# Patient Record
Sex: Female | Born: 1990 | Race: White | Hispanic: No | Marital: Single | Smoking: Current every day smoker
Health system: Southern US, Community
[De-identification: ages and names within clinical notes are randomized; demographics above are authoritative.]

## PROBLEM LIST (undated history)

## (undated) DIAGNOSIS — F419 Anxiety disorder, unspecified: Secondary | ICD-10-CM

## (undated) DIAGNOSIS — F99 Mental disorder, not otherwise specified: Secondary | ICD-10-CM

## (undated) DIAGNOSIS — J189 Pneumonia, unspecified organism: Secondary | ICD-10-CM

## (undated) DIAGNOSIS — J45909 Unspecified asthma, uncomplicated: Secondary | ICD-10-CM

## (undated) DIAGNOSIS — E569 Vitamin deficiency, unspecified: Secondary | ICD-10-CM

## (undated) HISTORY — PX: WISDOM TOOTH EXTRACTION: SHX21

---

## 2010-08-04 ENCOUNTER — Inpatient Hospital Stay (HOSPITAL_COMMUNITY): Admission: AD | Admit: 2010-08-04 | Discharge: 2010-08-07 | Payer: Self-pay | Admitting: Obstetrics and Gynecology

## 2010-12-13 LAB — CBC
HCT: 32.4 % — ABNORMAL LOW (ref 36.0–46.0)
HCT: 38.4 % (ref 36.0–46.0)
Hemoglobin: 11.4 g/dL — ABNORMAL LOW (ref 12.0–15.0)
Hemoglobin: 13.4 g/dL (ref 12.0–15.0)
MCH: 35.4 pg — ABNORMAL HIGH (ref 26.0–34.0)
MCV: 100.2 fL — ABNORMAL HIGH (ref 78.0–100.0)
MCV: 101 fL — ABNORMAL HIGH (ref 78.0–100.0)
Platelets: 124 10*3/uL — ABNORMAL LOW (ref 150–400)
RBC: 3.23 MIL/uL — ABNORMAL LOW (ref 3.87–5.11)
RBC: 3.8 MIL/uL — ABNORMAL LOW (ref 3.87–5.11)
RDW: 13.3 % (ref 11.5–15.5)
WBC: 10.4 10*3/uL (ref 4.0–10.5)
WBC: 9.2 10*3/uL (ref 4.0–10.5)

## 2010-12-13 LAB — RH IMMUNE GLOB WKUP(>/=20WKS)(NOT WOMEN'S HOSP)
DAT, IgG: NEGATIVE
Unit division: 0

## 2011-05-13 ENCOUNTER — Emergency Department (HOSPITAL_COMMUNITY)
Admission: EM | Admit: 2011-05-13 | Discharge: 2011-05-13 | Disposition: A | Discharge status: Home / Self Care | Payer: No Typology Code available for payment source | Attending: Emergency Medicine | Admitting: Emergency Medicine

## 2011-05-13 ENCOUNTER — Emergency Department (HOSPITAL_COMMUNITY): Payer: No Typology Code available for payment source

## 2011-05-13 DIAGNOSIS — R51 Headache: Secondary | ICD-10-CM | POA: Insufficient documentation

## 2011-05-13 DIAGNOSIS — M542 Cervicalgia: Secondary | ICD-10-CM | POA: Insufficient documentation

## 2011-05-13 DIAGNOSIS — T148XXA Other injury of unspecified body region, initial encounter: Secondary | ICD-10-CM | POA: Insufficient documentation

## 2011-05-13 DIAGNOSIS — S1093XA Contusion of unspecified part of neck, initial encounter: Secondary | ICD-10-CM | POA: Insufficient documentation

## 2011-05-13 DIAGNOSIS — S0003XA Contusion of scalp, initial encounter: Secondary | ICD-10-CM | POA: Insufficient documentation

## 2011-12-25 ENCOUNTER — Other Ambulatory Visit (HOSPITAL_COMMUNITY): Payer: Self-pay | Admitting: Obstetrics and Gynecology

## 2011-12-25 DIAGNOSIS — N83209 Unspecified ovarian cyst, unspecified side: Secondary | ICD-10-CM

## 2011-12-28 ENCOUNTER — Ambulatory Visit (HOSPITAL_COMMUNITY)
Admission: RE | Admit: 2011-12-28 | Discharge: 2011-12-28 | Disposition: A | Discharge status: Home / Self Care | Payer: Medicaid Other | Source: Ambulatory Visit | Attending: Obstetrics and Gynecology | Admitting: Obstetrics and Gynecology

## 2011-12-28 DIAGNOSIS — IMO0002 Reserved for concepts with insufficient information to code with codable children: Secondary | ICD-10-CM | POA: Insufficient documentation

## 2011-12-28 DIAGNOSIS — N83209 Unspecified ovarian cyst, unspecified side: Secondary | ICD-10-CM | POA: Insufficient documentation

## 2011-12-28 DIAGNOSIS — Z30431 Encounter for routine checking of intrauterine contraceptive device: Secondary | ICD-10-CM | POA: Insufficient documentation

## 2011-12-28 DIAGNOSIS — R1032 Left lower quadrant pain: Secondary | ICD-10-CM | POA: Insufficient documentation
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
## 2013-08-07 ENCOUNTER — Encounter (INDEPENDENT_AMBULATORY_CARE_PROVIDER_SITE_OTHER): Payer: Self-pay | Admitting: Surgery

## 2013-08-11 ENCOUNTER — Encounter (INDEPENDENT_AMBULATORY_CARE_PROVIDER_SITE_OTHER): Payer: Self-pay

## 2013-08-11 ENCOUNTER — Encounter (INDEPENDENT_AMBULATORY_CARE_PROVIDER_SITE_OTHER): Payer: Self-pay | Admitting: Surgery

## 2013-08-11 ENCOUNTER — Ambulatory Visit (INDEPENDENT_AMBULATORY_CARE_PROVIDER_SITE_OTHER): Payer: Medicaid Other | Admitting: Surgery

## 2013-08-11 VITALS — BP 108/70 | HR 96 | Temp 98.3°F | Resp 15 | Ht 71.0 in | Wt 125.0 lb

## 2013-08-11 DIAGNOSIS — K8020 Calculus of gallbladder without cholecystitis without obstruction: Secondary | ICD-10-CM

## 2013-08-11 NOTE — Progress Notes (Signed)
Patient ID: Sara Fletcher, female   DOB: 09/23/1991, 22 y.o.   MRN: [id number]  Chief Complaint  Patient presents with  . New Evaluation    eval Gallbladder    HPI Sara Fletcher is a 22 y.o. female.   HPI This is a pleasant 22 year old female referred by Dr. Mathis Bud for evaluation of symptomatic cholelithiasis. She essentially had right upper quadrant abdominal discomfort and intermittent nausea and vomiting for many years. It is becoming worse. The pain is moderate in intensity and always remains in the right upper quadrant. It is sharp in nature. She is otherwise without complaints. She denies jaundice. History reviewed. No pertinent past medical history.  History reviewed. No pertinent past surgical history.  Family History  Problem Relation Age of Onset  . Hypertension Mother   . Kidney disease Mother     recurring kidney stones    Social History History  Substance Use Topics  . Smoking status: Former Smoker    Quit date: 07/31/2013  . Smokeless tobacco: Never Used  . Alcohol Use: No     Comment: occasional    No Known Allergies  Current Outpatient Prescriptions  Medication Sig Dispense Refill  . dicyclomine (BENTYL) 20 MG tablet Take 20 mg by mouth every 6 (six) hours.      . ondansetron (ZOFRAN-ODT) 4 MG disintegrating tablet Take 4 mg by mouth every 8 (eight) hours as needed for nausea or vomiting.      . Vitamin D, Ergocalciferol, (DRISDOL) 50000 UNITS CAPS capsule Take 50,000 Units by mouth every 7 (seven) days.       No current facility-administered medications for this visit.    Review of Systems Review of Systems  Constitutional: Negative for fever, chills and unexpected weight change.  HENT: Negative for congestion, hearing loss, sore throat, trouble swallowing and voice change.   Eyes: Negative for visual disturbance.  Respiratory: Negative for cough and wheezing.   Cardiovascular: Negative for chest pain, palpitations and leg swelling.  Gastrointestinal:  Negative for nausea, vomiting, abdominal pain, diarrhea, constipation, blood in stool, abdominal distention and anal bleeding.  Genitourinary: Negative for hematuria, vaginal bleeding and difficulty urinating.  Musculoskeletal: Negative for arthralgias.  Skin: Negative for rash and wound.  Neurological: Negative for seizures, syncope and headaches.  Hematological: Negative for adenopathy. Does not bruise/bleed easily.  Psychiatric/Behavioral: Negative for confusion.    Blood pressure 108/70, pulse 96, temperature 98.3 F (36.8 C), temperature source Temporal, resp. rate 15, height 5\' 11"  (1.803 m), weight 125 lb (56.7 kg).  Physical Exam Physical Exam  Constitutional: She is oriented to person, place, and time. She appears well-developed and well-nourished. No distress.  HENT:  Head: Normocephalic and atraumatic.  Right Ear: External ear normal.  Left Ear: External ear normal.  Nose: Nose normal.  Mouth/Throat: Oropharynx is clear and moist. No oropharyngeal exudate.  Eyes: Conjunctivae are normal. Pupils are equal, round, and reactive to light. Right eye exhibits no discharge. Left eye exhibits no discharge. No scleral icterus.  Neck: Normal range of motion. Neck supple. No tracheal deviation present.  Cardiovascular: Normal rate, regular rhythm, normal heart sounds and intact distal pulses.   No murmur heard. Pulmonary/Chest: Effort normal and breath sounds normal. No respiratory distress. She has no wheezes. She has no rales.  Abdominal: Soft. Bowel sounds are normal. She exhibits no distension. There is no tenderness. There is no rebound.  Musculoskeletal: Normal range of motion. She exhibits no edema and no tenderness.  Lymphadenopathy:    She has no  cervical adenopathy.  Neurological: She is alert and oriented to person, place, and time.  Skin: Skin is warm and dry. No rash noted. She is not diaphoretic. No erythema.  Psychiatric: Her behavior is normal. Judgment normal.     Data Reviewed I have reviewed her data from her primary care physician. Her ultrasound shows cholelithiasis with a lot of gallstones. The bile duct is normal.  Her liver function tests are normal  Assessment    Symptomatic cholelithiasis     Plan    Laparoscopic cholecystectomy with possible cholangiogram is recommended. I discussed this with her in detail and gave her literature regarding the surgery. I discussed the risks of surgery which includes but is not limited to bleeding, infection, bile duct injury, bile leak, injury to other structures, need to convert to an open procedure, et Karie Soda. I also discussed postoperative recovery. She wishes to proceed with surgery        Zykeem Bauserman A 08/11/2013, 10:22 AM

## 2013-08-13 ENCOUNTER — Encounter (HOSPITAL_COMMUNITY): Payer: Self-pay | Admitting: Pharmacy Technician

## 2013-08-13 ENCOUNTER — Encounter (INDEPENDENT_AMBULATORY_CARE_PROVIDER_SITE_OTHER): Payer: Self-pay

## 2013-08-18 ENCOUNTER — Encounter (HOSPITAL_COMMUNITY)
Admission: RE | Admit: 2013-08-18 | Discharge: 2013-08-18 | Disposition: A | Discharge status: Home / Self Care | Payer: Medicaid Other | Source: Ambulatory Visit | Attending: Surgery | Admitting: Surgery

## 2013-08-18 ENCOUNTER — Encounter (HOSPITAL_COMMUNITY): Payer: Self-pay

## 2013-08-18 HISTORY — DX: Mental disorder, not otherwise specified: F99

## 2013-08-18 HISTORY — DX: Unspecified asthma, uncomplicated: J45.909

## 2013-08-18 HISTORY — DX: Anxiety disorder, unspecified: F41.9

## 2013-08-18 HISTORY — DX: Pneumonia, unspecified organism: J18.9

## 2013-08-18 HISTORY — DX: Vitamin deficiency, unspecified: E56.9

## 2013-08-18 LAB — CBC
HCT: 38.2 % (ref 36.0–46.0)
Hemoglobin: 13.2 g/dL (ref 12.0–15.0)
MCH: 32.5 pg (ref 26.0–34.0)
MCHC: 34.6 g/dL (ref 30.0–36.0)
Platelets: 134 10*3/uL — ABNORMAL LOW (ref 150–400)
RDW: 12.2 % (ref 11.5–15.5)
WBC: 3.9 10*3/uL — ABNORMAL LOW (ref 4.0–10.5)

## 2013-08-18 LAB — HCG, SERUM, QUALITATIVE: Preg, Serum: NEGATIVE

## 2013-08-18 NOTE — Pre-Procedure Instructions (Signed)
Sara Fletcher  08/18/2013   Your procedure is scheduled on: Wednesday, November 19th.              Report to [facility], Main Entrance or Entrance "A" at 9:47M.  Call this number if you have problems the morning of surgery: [phone]   Remember:   Do not eat food or drink liquids after midnight.   Take these medicines the morning of surgery with A SIP OF WATER: None   Do not wear jewelry, make-up or nail polish.  Do not wear lotions, powders, or perfumes. You may wear deodorant.  Do not shave 48 hours prior to surgery. Men may shave face and neck.  Do not bring valuables to the hospital.  [facility] is not responsible for any belongings or valuables.               Contacts, dentures or bridgework may not be worn into surgery.  Leave suitcase in the car. After surgery it may be brought to your room.  For patients admitted to the hospital, discharge time is determined by your treatment team.               Patients discharged the day of surgery will not be allowed to drive home.  Name and phone number of your driver: -   Special Instructions: Shower using CHG 2 nights before surgery and the night before surgery.  If you shower the day of surgery use CHG.  Use special wash - you have one bottle of CHG for all showers.  You should use approximately 1/3 of the bottle for each shower.   Please read over the following fact sheets that you were given: Pain Booklet, Coughing and Deep Breathing and Surgical Site Infection Prevention

## 2013-08-18 NOTE — Pre-Procedure Instructions (Signed)
Sara Fletcher  08/18/2013   Your procedure is scheduled on: Wednesday, November 19th.              Report to [facility], Main Entrance or Entrance "A" at 9:4M.  Call this number if you have problems the morning of surgery: [phone]   Remember:   Do not eat food or drink liquids after midnight.   Take these medicines the morning of surgery with A SIP OF WATER: None   Do not wear jewelry, make-up or nail polish.  Do not wear lotions, powders, or perfumes. You may wear deodorant.  Do not shave 48 hours prior to surgery.   Do not bring valuables to the hospital.  [facility] is not responsible for any belongings or valuables.               Contacts, dentures or bridgework may not be worn into surgery.  Leave suitcase in the car. After surgery it may be brought to your room.  For patients admitted to the hospital, discharge time is determined by your treatment team.               Patients discharged the day of surgery will not be allowed to drive home.  Name and phone number of your driver: - Angie Fava [phone]   Special Instructions: Shower using CHG 2 nights before surgery and the night before surgery.  If you shower the day of surgery use CHG.  Use special wash - you have one bottle of CHG for all showers.  You should use approximately 1/3 of the bottle for each shower.   Please read over the following fact sheets that you were given: Pain Booklet, Coughing and Deep Breathing and Surgical Site Infection Prevention

## 2013-08-19 ENCOUNTER — Encounter (HOSPITAL_COMMUNITY): Payer: Self-pay | Admitting: Certified Registered"

## 2013-08-19 MED ORDER — CEFAZOLIN SODIUM-DEXTROSE 2-3 GM-% IV SOLR
2.0000 g | INTRAVENOUS | Status: AC
  Administered 2013-08-20: 2 g via INTRAVENOUS
  Filled 2013-08-19: qty 50

## 2013-08-20 ENCOUNTER — Observation Stay (HOSPITAL_COMMUNITY)
Admission: RE | Admit: 2013-08-20 | Discharge: 2013-08-21 | Disposition: A | Discharge status: Home / Self Care | Payer: Medicaid Other | Source: Ambulatory Visit | Attending: Surgery | Admitting: Surgery

## 2013-08-20 ENCOUNTER — Ambulatory Visit (HOSPITAL_COMMUNITY): Payer: Medicaid Other | Admitting: Certified Registered"

## 2013-08-20 ENCOUNTER — Encounter (HOSPITAL_COMMUNITY)
Admission: RE | Disposition: A | Discharge status: Home / Self Care | Payer: Self-pay | Source: Ambulatory Visit | Attending: Surgery

## 2013-08-20 ENCOUNTER — Encounter (HOSPITAL_COMMUNITY): Payer: Self-pay | Admitting: *Deleted

## 2013-08-20 ENCOUNTER — Encounter (HOSPITAL_COMMUNITY): Payer: Medicaid Other | Admitting: Certified Registered"

## 2013-08-20 DIAGNOSIS — K8010 Calculus of gallbladder with chronic cholecystitis without obstruction: Principal | ICD-10-CM | POA: Insufficient documentation

## 2013-08-20 DIAGNOSIS — Z87891 Personal history of nicotine dependence: Secondary | ICD-10-CM | POA: Insufficient documentation

## 2013-08-20 DIAGNOSIS — F411 Generalized anxiety disorder: Secondary | ICD-10-CM | POA: Insufficient documentation

## 2013-08-20 DIAGNOSIS — F3289 Other specified depressive episodes: Secondary | ICD-10-CM | POA: Insufficient documentation

## 2013-08-20 DIAGNOSIS — J45909 Unspecified asthma, uncomplicated: Secondary | ICD-10-CM | POA: Insufficient documentation

## 2013-08-20 DIAGNOSIS — F329 Major depressive disorder, single episode, unspecified: Secondary | ICD-10-CM | POA: Insufficient documentation

## 2013-08-20 HISTORY — PX: CHOLECYSTECTOMY: SHX55

## 2013-08-20 SURGERY — LAPAROSCOPIC CHOLECYSTECTOMY
Anesthesia: General | Site: Abdomen | Wound class: Clean Contaminated

## 2013-08-20 MED ORDER — ONDANSETRON HCL 4 MG/2ML IJ SOLN
INTRAMUSCULAR | Status: DC | PRN
  Administered 2013-08-20: 4 mg via INTRAVENOUS

## 2013-08-20 MED ORDER — ROCURONIUM BROMIDE 100 MG/10ML IV SOLN
INTRAVENOUS | Status: DC | PRN
  Administered 2013-08-20: 35 mg via INTRAVENOUS

## 2013-08-20 MED ORDER — ONDANSETRON HCL 4 MG/2ML IJ SOLN
4.0000 mg | Freq: Four times a day (QID) | INTRAMUSCULAR | Status: DC | PRN
  Administered 2013-08-20: 4 mg via INTRAVENOUS
  Filled 2013-08-20: qty 2

## 2013-08-20 MED ORDER — FENTANYL CITRATE 0.05 MG/ML IJ SOLN
INTRAMUSCULAR | Status: DC | PRN
  Administered 2013-08-20 (×2): 50 ug via INTRAVENOUS
  Administered 2013-08-20: 100 ug via INTRAVENOUS
  Administered 2013-08-20: 50 ug via INTRAVENOUS

## 2013-08-20 MED ORDER — LIDOCAINE HCL (CARDIAC) 20 MG/ML IV SOLN
INTRAVENOUS | Status: DC | PRN
  Administered 2013-08-20: 80 mg via INTRAVENOUS

## 2013-08-20 MED ORDER — OXYCODONE HCL 5 MG PO TABS
5.0000 mg | ORAL_TABLET | ORAL | Status: DC | PRN
  Administered 2013-08-20: 5 mg via ORAL
  Administered 2013-08-21 (×2): 10 mg via ORAL
  Filled 2013-08-20 (×2): qty 2
  Filled 2013-08-20: qty 1
  Filled 2013-08-20 (×2): qty 2

## 2013-08-20 MED ORDER — HYDROCODONE-ACETAMINOPHEN 5-325 MG PO TABS: 1.0000 | ORAL_TABLET | ORAL | Status: DC | PRN

## 2013-08-20 MED ORDER — HYDROMORPHONE HCL PF 1 MG/ML IJ SOLN
0.2500 mg | INTRAMUSCULAR | Status: DC | PRN
  Administered 2013-08-20 (×4): 0.5 mg via INTRAVENOUS

## 2013-08-20 MED ORDER — HYDROMORPHONE HCL PF 1 MG/ML IJ SOLN
INTRAMUSCULAR | Status: AC
  Filled 2013-08-20: qty 1

## 2013-08-20 MED ORDER — HYDROMORPHONE HCL PF 1 MG/ML IJ SOLN
INTRAMUSCULAR | Status: DC | PRN
  Administered 2013-08-20 (×2): 0.5 mg via INTRAVENOUS

## 2013-08-20 MED ORDER — BUPIVACAINE HCL (PF) 0.25 % IJ SOLN
INTRAMUSCULAR | Status: AC
  Filled 2013-08-20: qty 30

## 2013-08-20 MED ORDER — ONDANSETRON HCL 4 MG/2ML IJ SOLN
4.0000 mg | Freq: Once | INTRAMUSCULAR | Status: AC | PRN
  Administered 2013-08-20: 4 mg via INTRAVENOUS
  Filled 2013-08-20: qty 2

## 2013-08-20 MED ORDER — ONDANSETRON HCL 4 MG/2ML IJ SOLN: INTRAMUSCULAR | Status: DC | PRN

## 2013-08-20 MED ORDER — MIDAZOLAM HCL 5 MG/5ML IJ SOLN
INTRAMUSCULAR | Status: DC | PRN
  Administered 2013-08-20: 2 mg via INTRAVENOUS
  Administered 2013-08-20: 1 mg via INTRAVENOUS

## 2013-08-20 MED ORDER — SODIUM CHLORIDE 0.9 % IJ SOLN: 3.0000 mL | INTRAMUSCULAR | Status: DC | PRN

## 2013-08-20 MED ORDER — BUPIVACAINE-EPINEPHRINE 0.25% -1:200000 IJ SOLN
INTRAMUSCULAR | Status: DC | PRN
  Administered 2013-08-20: 20 mL

## 2013-08-20 MED ORDER — LACTATED RINGERS IV SOLN
INTRAVENOUS | Status: DC | PRN
  Administered 2013-08-20 (×2): via INTRAVENOUS

## 2013-08-20 MED ORDER — MORPHINE SULFATE 4 MG/ML IJ SOLN
4.0000 mg | INTRAMUSCULAR | Status: DC | PRN
  Administered 2013-08-20 – 2013-08-21 (×2): 4 mg via INTRAVENOUS
  Filled 2013-08-20 (×2): qty 1

## 2013-08-20 MED ORDER — SODIUM CHLORIDE 0.9 % IV SOLN: 250.0000 mL | INTRAVENOUS | Status: DC | PRN

## 2013-08-20 MED ORDER — LACTATED RINGERS IV SOLN
INTRAVENOUS | Status: DC
  Administered 2013-08-20: 11:00:00 via INTRAVENOUS

## 2013-08-20 MED ORDER — KETOROLAC TROMETHAMINE 30 MG/ML IJ SOLN
INTRAMUSCULAR | Status: DC | PRN
  Administered 2013-08-20: 30 mg via INTRAVENOUS

## 2013-08-20 MED ORDER — PROPOFOL 10 MG/ML IV BOLUS
INTRAVENOUS | Status: DC | PRN
  Administered 2013-08-20: 100 mg via INTRAVENOUS

## 2013-08-20 MED ORDER — SODIUM CHLORIDE 0.9 % IJ SOLN: 3.0000 mL | Freq: Two times a day (BID) | INTRAMUSCULAR | Status: DC

## 2013-08-20 MED ORDER — ACETAMINOPHEN 650 MG RE SUPP: 650.0000 mg | RECTAL | Status: DC | PRN

## 2013-08-20 MED ORDER — DEXAMETHASONE SODIUM PHOSPHATE 10 MG/ML IJ SOLN
INTRAMUSCULAR | Status: DC | PRN
  Administered 2013-08-20: 8 mg via INTRAVENOUS

## 2013-08-20 MED ORDER — NEOSTIGMINE METHYLSULFATE 1 MG/ML IJ SOLN
INTRAMUSCULAR | Status: DC | PRN
  Administered 2013-08-20: 3 mg via INTRAVENOUS

## 2013-08-20 MED ORDER — ACETAMINOPHEN 325 MG PO TABS
650.0000 mg | ORAL_TABLET | ORAL | Status: DC | PRN
Start: 2013-08-20 — End: 2013-08-21

## 2013-08-20 MED ORDER — DEXMEDETOMIDINE HCL IN NACL 200 MCG/50ML IV SOLN
0.4000 ug/kg/h | INTRAVENOUS | Status: AC
  Administered 2013-08-20: 24 ug/h via INTRAVENOUS
  Filled 2013-08-20: qty 50

## 2013-08-20 MED ORDER — SODIUM CHLORIDE 0.9 % IR SOLN
Status: DC | PRN
  Administered 2013-08-20: 1000 mL

## 2013-08-20 MED ORDER — GLYCOPYRROLATE 0.2 MG/ML IJ SOLN
INTRAMUSCULAR | Status: DC | PRN
  Administered 2013-08-20: 0.4 mg via INTRAVENOUS

## 2013-08-20 MED ORDER — KETOROLAC TROMETHAMINE 30 MG/ML IJ SOLN
15.0000 mg | Freq: Once | INTRAMUSCULAR | Status: DC | PRN
Start: 2013-08-20 — End: 2013-08-20

## 2013-08-20 SURGICAL SUPPLY — 36 items
APPLIER CLIP 5 13 M/L LIGAMAX5 (MISCELLANEOUS) ×2
BANDAGE ADHESIVE 1X3 (GAUZE/BANDAGES/DRESSINGS) ×8 IMPLANT
BENZOIN TINCTURE PRP APPL 2/3 (GAUZE/BANDAGES/DRESSINGS) ×2 IMPLANT
CANISTER SUCTION 2500CC (MISCELLANEOUS) ×2 IMPLANT
CHLORAPREP W/TINT 26ML (MISCELLANEOUS) ×2 IMPLANT
CLIP APPLIE 5 13 M/L LIGAMAX5 (MISCELLANEOUS) ×1 IMPLANT
CLSR STERI-STRIP ANTIMIC 1/2X4 (GAUZE/BANDAGES/DRESSINGS) ×2 IMPLANT
COVER MAYO STAND STRL (DRAPES) IMPLANT
COVER SURGICAL LIGHT HANDLE (MISCELLANEOUS) ×2 IMPLANT
DECANTER SPIKE VIAL GLASS SM (MISCELLANEOUS) ×2 IMPLANT
DRAPE C-ARM 42X72 X-RAY (DRAPES) IMPLANT
ELECT REM PT RETURN 9FT ADLT (ELECTROSURGICAL) ×2
ELECTRODE REM PT RTRN 9FT ADLT (ELECTROSURGICAL) ×1 IMPLANT
GLOVE SS BIOGEL STRL SZ 7 (GLOVE) ×1 IMPLANT
GLOVE SUPERSENSE BIOGEL SZ 7 (GLOVE) ×1
GLOVE SURG SIGNA 7.5 PF LTX (GLOVE) ×2 IMPLANT
GLOVE SURG SS PI 6.5 STRL IVOR (GLOVE) ×2 IMPLANT
GOWN STRL NON-REIN LRG LVL3 (GOWN DISPOSABLE) ×6 IMPLANT
GOWN STRL REIN XL XLG (GOWN DISPOSABLE) ×2 IMPLANT
KIT BASIN OR (CUSTOM PROCEDURE TRAY) ×2 IMPLANT
KIT ROOM TURNOVER OR (KITS) ×2 IMPLANT
NS IRRIG 1000ML POUR BTL (IV SOLUTION) ×2 IMPLANT
PAD ARMBOARD 7.5X6 YLW CONV (MISCELLANEOUS) ×2 IMPLANT
POUCH SPECIMEN RETRIEVAL 10MM (ENDOMECHANICALS) ×2 IMPLANT
SCISSORS LAP 5X35 DISP (ENDOMECHANICALS) ×2 IMPLANT
SET CHOLANGIOGRAPH 5 50 .035 (SET/KITS/TRAYS/PACK) IMPLANT
SET IRRIG TUBING LAPAROSCOPIC (IRRIGATION / IRRIGATOR) ×2 IMPLANT
SLEEVE ENDOPATH XCEL 5M (ENDOMECHANICALS) ×4 IMPLANT
SPECIMEN JAR SMALL (MISCELLANEOUS) ×2 IMPLANT
SUT MON AB 4-0 PC3 18 (SUTURE) ×2 IMPLANT
TOWEL OR 17X24 6PK STRL BLUE (TOWEL DISPOSABLE) ×2 IMPLANT
TOWEL OR 17X26 10 PK STRL BLUE (TOWEL DISPOSABLE) ×2 IMPLANT
TRAY LAPAROSCOPIC (CUSTOM PROCEDURE TRAY) ×2 IMPLANT
TROCAR XCEL BLUNT TIP 100MML (ENDOMECHANICALS) ×2 IMPLANT
TROCAR XCEL NON-BLD 5MMX100MML (ENDOMECHANICALS) ×2 IMPLANT
WATER STERILE IRR 1000ML POUR (IV SOLUTION) IMPLANT

## 2013-08-20 NOTE — Op Note (Signed)
Laparoscopic Cholecystectomy Procedure Note  Indications: This patient presents with symptomatic gallbladder disease and will undergo laparoscopic cholecystectomy.  Pre-operative Diagnosis: Calculus of gallbladder without mention of cholecystitis or obstruction  Post-operative Diagnosis: Same  Surgeon: Abigail Miyamoto A   Assistants: 0  Anesthesia: General endotracheal anesthesia  ASA Class: 1  Procedure Details  The patient was seen again in the Holding Room. The risks, benefits, complications, treatment options, and expected outcomes were discussed with the patient. The possibilities of reaction to medication, pulmonary aspiration, perforation of viscus, bleeding, recurrent infection, finding a normal gallbladder, the need for additional procedures, failure to diagnose a condition, the possible need to convert to an open procedure, and creating a complication requiring transfusion or operation were discussed with the patient. The likelihood of improving the patient's symptoms with return to their baseline status is good.  The patient and/or family concurred with the proposed plan, giving informed consent. The site of surgery properly noted. The patient was taken to Operating Room, identified as Sara Fletcher and the procedure verified as Laparoscopic Cholecystectomy with Intraoperative Cholangiogram. A Time Out was held and the above information confirmed.  Prior to the induction of general anesthesia, antibiotic prophylaxis was administered. General endotracheal anesthesia was then administered and tolerated well. After the induction, the abdomen was prepped with Chloraprep and draped in sterile fashion. The patient was positioned in the supine position.  Local anesthetic agent was injected into the skin near the umbilicus and an incision made. We dissected down to the abdominal fascia with blunt dissection.  The fascia was incised vertically and we entered the peritoneal cavity bluntly.  A  pursestring suture of 0-Vicryl was placed around the fascial opening.  The Hasson cannula was inserted and secured with the stay suture.  Pneumoperitoneum was then created with CO2 and tolerated well without any adverse changes in the patient's vital signs. An 11-mm port was placed in the subxiphoid position.  Two 5-mm ports were placed in the right upper quadrant. All skin incisions were infiltrated with a local anesthetic agent before making the incision and placing the trocars.   We positioned the patient in reverse Trendelenburg, tilted slightly to the patient's left.  The gallbladder was identified, the fundus grasped and retracted cephalad. Adhesions were lysed bluntly and with the electrocautery where indicated, taking care not to injure any adjacent organs or viscus. The infundibulum was grasped and retracted laterally, exposing the peritoneum overlying the triangle of Calot. This was then divided and exposed in a blunt fashion. The cystic duct was clearly identified and bluntly dissected circumferentially. A critical view of the cystic duct and cystic artery was obtained.  The cystic duct was then ligated with clips and divided. The cystic artery was, dissected free, ligated with clips and divided as well.   The gallbladder was dissected from the liver bed in retrograde fashion with the electrocautery. The gallbladder was removed and placed in an Endocatch sac. The liver bed was irrigated and inspected. Hemostasis was achieved with the electrocautery. Copious irrigation was utilized and was repeatedly aspirated until clear.  The gallbladder and Endocatch sac were then removed through the umbilical port site.  The pursestring suture was used to close the umbilical fascia.    We again inspected the right upper quadrant for hemostasis.  Pneumoperitoneum was released as we removed the trocars.  4-0 Monocryl was used to close the skin.   Benzoin, steri-strips, and clean dressings were applied. The patient  was then extubated and brought to the recovery room in  stable condition. Instrument, sponge, and needle counts were correct at closure and at the conclusion of the case.   Findings: Chronic Cholecystitis with Cholelithiasis  Estimated Blood Loss: Minimal         Drains: 0         Specimens: Gallbladder           Complications: None; patient tolerated the procedure well.         Disposition: PACU - hemodynamically stable.         Condition: stable

## 2013-08-20 NOTE — Anesthesia Procedure Notes (Signed)
Procedure Name: Intubation Date/Time: 08/20/2013 12:59 PM Performed by: Ellin Goodie Pre-anesthesia Checklist: Patient identified, Emergency Drugs available, Suction available, Patient being monitored and Timeout performed Patient Re-evaluated:Patient Re-evaluated prior to inductionOxygen Delivery Method: Circle system utilized Preoxygenation: Pre-oxygenation with 100% oxygen Intubation Type: IV induction Ventilation: Mask ventilation without difficulty Laryngoscope Size: Mac and 3 Grade View: Grade I Tube type: Oral Tube size: 7.5 mm Number of attempts: 1 Airway Equipment and Method: Stylet Placement Confirmation: ETT inserted through vocal cords under direct vision,  positive ETCO2 and breath sounds checked- equal and bilateral Secured at: 21 cm Tube secured with: Tape Dental Injury: Teeth and Oropharynx as per pre-operative assessment  Comments: Easy atraumatic induction and intubation with MAC 3 blade.  Dr. Noreene Larsson verified placement of ETT.  Carlynn Herald, CRNA

## 2013-08-20 NOTE — Anesthesia Preprocedure Evaluation (Addendum)
Anesthesia Evaluation  Patient identified by MRN, date of birth, ID band Patient awake    Reviewed: Allergy & Precautions, H&P , NPO status , Patient's Chart, lab work & pertinent test results, reviewed documented beta blocker date and time   Airway Mallampati: I TM Distance: >3 FB Neck ROM: Full    Dental  (+) Teeth Intact and Dental Advisory Given   Pulmonary asthma , pneumonia -, resolved, former smoker,  breath sounds clear to auscultation        Cardiovascular negative cardio ROS  Rhythm:Regular Rate:Normal     Neuro/Psych Anxiety Depression    GI/Hepatic negative GI ROS, Neg liver ROS,   Endo/Other  negative endocrine ROS  Renal/GU negative Renal ROS     Musculoskeletal negative musculoskeletal ROS (+)   Abdominal (+)  Abdomen: soft. Bowel sounds: normal.  Peds  Hematology negative hematology ROS (+)   Anesthesia Other Findings   Reproductive/Obstetrics negative OB ROS                       Anesthesia Physical Anesthesia Plan  ASA: II  Anesthesia Plan: General   Post-op Pain Management:    Induction: Intravenous  Airway Management Planned: Oral ETT  Additional Equipment:   Intra-op Plan:   Post-operative Plan: Extubation in OR  Informed Consent: I have reviewed the patients History and Physical, chart, labs and discussed the procedure including the risks, benefits and alternatives for the proposed anesthesia with the patient or authorized representative who has indicated his/her understanding and acceptance.     Plan Discussed with: CRNA and Anesthesiologist  Anesthesia Plan Comments: (Symptomatic cholelithiasis  Plan GA with oral ETT  Kipp Brood, MD)        Anesthesia Quick Evaluation

## 2013-08-20 NOTE — H&P (Signed)
Patient ID: Sara Fletcher, female DOB: 03/31/1991, 22 y.o. MRN: [id number]  Chief Complaint   Patient presents with   .  New Evaluation     eval Gallbladder   HPI  Sara Fletcher is a 22 y.o. female.  HPI  This is a pleasant 22 year old female referred by Dr. Mathis Bud for evaluation of symptomatic cholelithiasis. She essentially had right upper quadrant abdominal discomfort and intermittent nausea and vomiting for many years. It is becoming worse. The pain is moderate in intensity and always remains in the right upper quadrant. It is sharp in nature. She is otherwise without complaints. She denies jaundice.  History reviewed. No pertinent past medical history.  History reviewed. No pertinent past surgical history.  Family History   Problem  Relation  Age of Onset   .  Hypertension  Mother    .  Kidney disease  Mother      recurring kidney stones   Social History  History   Substance Use Topics   .  Smoking status:  Former Smoker     Quit date:  07/31/2013   .  Smokeless tobacco:  Never Used   .  Alcohol Use:  No      Comment: occasional   No Known Allergies  Current Outpatient Prescriptions   Medication  Sig  Dispense  Refill   .  dicyclomine (BENTYL) 20 MG tablet  Take 20 mg by mouth every 6 (six) hours.     .  ondansetron (ZOFRAN-ODT) 4 MG disintegrating tablet  Take 4 mg by mouth every 8 (eight) hours as needed for nausea or vomiting.     .  Vitamin D, Ergocalciferol, (DRISDOL) 50000 UNITS CAPS capsule  Take 50,000 Units by mouth every 7 (seven) days.      No current facility-administered medications for this visit.   Review of Systems  Review of Systems  Constitutional: Negative for fever, chills and unexpected weight change.  HENT: Negative for congestion, hearing loss, sore throat, trouble swallowing and voice change.  Eyes: Negative for visual disturbance.  Respiratory: Negative for cough and wheezing.  Cardiovascular: Negative for chest pain, palpitations and leg swelling.   Gastrointestinal: Negative for nausea, vomiting, abdominal pain, diarrhea, constipation, blood in stool, abdominal distention and anal bleeding.  Genitourinary: Negative for hematuria, vaginal bleeding and difficulty urinating.  Musculoskeletal: Negative for arthralgias.  Skin: Negative for rash and wound.  Neurological: Negative for seizures, syncope and headaches.  Hematological: Negative for adenopathy. Does not bruise/bleed easily.  Psychiatric/Behavioral: Negative for confusion.  Blood pressure 108/70, pulse 96, temperature 98.3 F (36.8 C), temperature source Temporal, resp. rate 15, height 5\' 11"  (1.803 m), weight 125 lb (56.7 kg).  Physical Exam  Physical Exam  Constitutional: She is oriented to person, place, and time. She appears well-developed and well-nourished. No distress.  HENT:  Head: Normocephalic and atraumatic.  Right Ear: External ear normal.  Left Ear: External ear normal.  Nose: Nose normal.  Mouth/Throat: Oropharynx is clear and moist. No oropharyngeal exudate.  Eyes: Conjunctivae are normal. Pupils are equal, round, and reactive to light. Right eye exhibits no discharge. Left eye exhibits no discharge. No scleral icterus.  Neck: Normal range of motion. Neck supple. No tracheal deviation present.  Cardiovascular: Normal rate, regular rhythm, normal heart sounds and intact distal pulses.  No murmur heard.  Pulmonary/Chest: Effort normal and breath sounds normal. No respiratory distress. She has no wheezes. She has no rales.  Abdominal: Soft. Bowel sounds are normal. She exhibits no distension. There  is no tenderness. There is no rebound.  Musculoskeletal: Normal range of motion. She exhibits no edema and no tenderness.  Lymphadenopathy:  She has no cervical adenopathy.  Neurological: She is alert and oriented to person, place, and time.  Skin: Skin is warm and dry. No rash noted. She is not diaphoretic. No erythema.  Psychiatric: Her behavior is normal. Judgment  normal.   Data Reviewed  I have reviewed her data from her primary care physician. Her ultrasound shows cholelithiasis with a lot of gallstones. The bile duct is normal. Her liver function tests are normal   Assessment  Symptomatic cholelithiasis   Plan  Laparoscopic cholecystectomy with possible cholangiogram is recommended. I discussed this with her in detail and gave her literature regarding the surgery. I discussed the risks of surgery which includes but is not limited to bleeding, infection, bile duct injury, bile leak, injury to other structures, need to convert to an open procedure, et Karie Soda. I also discussed postoperative recovery. She wishes to proceed with surgery

## 2013-08-20 NOTE — Progress Notes (Signed)
REPORT RECEIVED FROM Quitman Livings; CLIENT C/O 8/10 PAIN AND NAUSEA

## 2013-08-20 NOTE — Transfer of Care (Signed)
Immediate Anesthesia Transfer of Care Note  Patient: Sara Fletcher  Procedure(s) Performed: Procedure(s): LAPAROSCOPIC CHOLECYSTECTOMY WITH POSSIBLE CHOLANGIOGRAM (N/A)  Patient Location: PACU  Anesthesia Type:General  Level of Consciousness: awake, alert  and oriented  Airway & Oxygen Therapy: Patient Spontanous Breathing  Post-op Assessment: Report given to PACU RN  Post vital signs: stable  Complications: No apparent anesthesia complications

## 2013-08-20 NOTE — Anesthesia Postprocedure Evaluation (Signed)
  Anesthesia Post-op Note  Patient: [name]  Procedure(s) Performed: Procedure(s): LAPAROSCOPIC CHOLECYSTECTOMY WITH POSSIBLE CHOLANGIOGRAM (N/A)  Patient Location: PACU  Anesthesia Type:General  Level of Consciousness: awake, alert , oriented and sedated  Airway and Oxygen Therapy: Patient Spontanous Breathing  Post-op Pain: mild  Post-op Assessment: Post-op Vital signs reviewed, Patient's Cardiovascular Status Stable, Respiratory Function Stable, Patent Airway, No signs of Nausea or vomiting and Pain level controlled  Post-op Vital Signs: stable  Complications: No apparent anesthesia complications

## 2013-08-20 NOTE — Progress Notes (Signed)
DR Noreene Larsson IN AND GAVE MEDICATION AND PER DR JOSLIN CLIENT NEEDS TO BE ON CARDIAC MONITOR AND TRANSFERRED BACK TO PACU AND REPORT GIVEN TO DAVID,RN ZOFRAN GIVEN FOR NAUSEA

## 2013-08-21 MED ORDER — KETOROLAC TROMETHAMINE 30 MG/ML IJ SOLN
30.0000 mg | Freq: Once | INTRAMUSCULAR | Status: AC
  Administered 2013-08-21: 30 mg via INTRAVENOUS

## 2013-08-21 MED ORDER — KETOROLAC TROMETHAMINE 30 MG/ML IJ SOLN
INTRAMUSCULAR | Status: AC
  Filled 2013-08-21: qty 1

## 2013-08-21 NOTE — Discharge Summary (Signed)
Physician Discharge Summary  Patient ID: Sara Fletcher MRN: [id number] DOB/AGE: [birth year] 22 y.o.  Admit date: 08/20/2013 Discharge date: 08/21/2013  Admission Diagnoses:  Discharge Diagnoses:  Active Problems:   * No active hospital problems. * symptomatic cholelithiasis  Discharged Condition: good  Hospital Course: placed in observation post op for pain and nausea.  Feeling much better the following morning so discharged home  Consults: None  Significant Diagnostic Studies:   Treatments: surgery: lap chole  Discharge Exam: Blood pressure 96/54, pulse 70, temperature 98.3 F (36.8 C), temperature source Oral, resp. rate 18, height 5\' 10"  (1.778 m), weight 126 lb (57.153 kg), last menstrual period 08/17/2013, SpO2 99.00%. General appearance: alert, cooperative and no distress Resp: clear to auscultation bilaterally Incision/Wound: abdomen soft, dressings dry  Disposition: 01-Home or Self Care   Future Appointments Provider Department Dept Phone   09/11/2013 1:30 PM Shelly Rubenstein, MD [facility] Surgery, [place] [phone]       Medication List         dicyclomine 20 MG tablet  Commonly known as:  BENTYL  Take 20 mg by mouth every 6 (six) hours.     HYDROcodone-acetaminophen 5-325 MG per tablet  Commonly known as:  NORCO  Take 1-2 tablets by mouth every 4 (four) hours as needed.     Vitamin D (Ergocalciferol) 50000 UNITS Caps capsule  Commonly known as:  DRISDOL  Take 50,000 Units by mouth every 7 (seven) days.           Follow-up Information   Follow up with [facility], MD In 2 weeks.   Specialty:  General Surgery   Contact information:   [address] [place] [address] [phone]       Signed: Shelly Rubenstein 08/21/2013, 8:02 AM

## 2013-08-22 ENCOUNTER — Encounter (HOSPITAL_COMMUNITY): Payer: Self-pay | Admitting: Surgery

## 2013-08-25 ENCOUNTER — Telehealth (INDEPENDENT_AMBULATORY_CARE_PROVIDER_SITE_OTHER): Payer: Self-pay

## 2013-08-25 NOTE — Telephone Encounter (Signed)
The pt called s/p lap chole on 11/19 by Dr Magnus Ivan.  She states one of her steri strips has come off.  The incision is not open.  She wants to know what to do.  I advised if she has a steri strip she can replace it or she can keep it covered with a bandage for a couple days.  Monitor the incision to make sure it stays closed and there are no signs of infection.  She also has more pain beside her umbilical incision than her others.  I told her that is the biggest incision and where the gallbladder comes up through so this is common.  She understands.  I advised she can take some Ibuprofen or use ice on the area.

## 2013-09-11 ENCOUNTER — Encounter (INDEPENDENT_AMBULATORY_CARE_PROVIDER_SITE_OTHER): Payer: Self-pay | Admitting: Surgery

## 2013-09-11 ENCOUNTER — Ambulatory Visit (INDEPENDENT_AMBULATORY_CARE_PROVIDER_SITE_OTHER): Payer: Medicaid Other | Admitting: Surgery

## 2013-09-11 VITALS — BP 122/72 | HR 68 | Temp 97.6°F | Resp 16 | Ht 71.0 in | Wt 125.4 lb

## 2013-09-11 DIAGNOSIS — Z09 Encounter for follow-up examination after completed treatment for conditions other than malignant neoplasm: Secondary | ICD-10-CM

## 2013-09-11 NOTE — Progress Notes (Signed)
Subjective:     Patient ID: Sara Fletcher, female   DOB: [birth year], 22 y.o.   MRN: [id number]  HPI She is here for first postop visit status post laparoscopic cholecystectomy. She is doing well and most all of her symptoms have completely resolved.  Review of Systems     Objective:   Physical Exam On exam, her incisions are well healed. The final pathology showed chronic cholecystitis with gallstones    Assessment:     Patient stable postop     Plan:     She may resume her normal activities. I will see her back as needed

## 2014-05-27 ENCOUNTER — Encounter (HOSPITAL_COMMUNITY): Payer: Self-pay

## 2014-05-27 ENCOUNTER — Inpatient Hospital Stay (HOSPITAL_COMMUNITY)
Admission: AD | Admit: 2014-05-27 | Discharge: 2014-05-27 | Disposition: A | Discharge status: Home / Self Care | Payer: Medicaid Other | Source: Ambulatory Visit | Attending: Obstetrics & Gynecology | Admitting: Obstetrics & Gynecology

## 2014-05-27 DIAGNOSIS — A499 Bacterial infection, unspecified: Secondary | ICD-10-CM

## 2014-05-27 DIAGNOSIS — Z3200 Encounter for pregnancy test, result unknown: Secondary | ICD-10-CM | POA: Diagnosis present

## 2014-05-27 DIAGNOSIS — Z3202 Encounter for pregnancy test, result negative: Secondary | ICD-10-CM | POA: Diagnosis not present

## 2014-05-27 DIAGNOSIS — B9689 Other specified bacterial agents as the cause of diseases classified elsewhere: Secondary | ICD-10-CM

## 2014-05-27 DIAGNOSIS — R109 Unspecified abdominal pain: Secondary | ICD-10-CM | POA: Insufficient documentation

## 2014-05-27 DIAGNOSIS — N760 Acute vaginitis: Secondary | ICD-10-CM

## 2014-05-27 DIAGNOSIS — F17200 Nicotine dependence, unspecified, uncomplicated: Secondary | ICD-10-CM | POA: Diagnosis not present

## 2014-05-27 LAB — CBC
HCT: 39.5 % (ref 36.0–46.0)
HEMOGLOBIN: 13.3 g/dL (ref 12.0–15.0)
MCH: 32.2 pg (ref 26.0–34.0)
MCHC: 33.7 g/dL (ref 30.0–36.0)
MCV: 95.6 fL (ref 78.0–100.0)
Platelets: 129 10*3/uL — ABNORMAL LOW (ref 150–400)
RBC: 4.13 MIL/uL (ref 3.87–5.11)
RDW: 12.4 % (ref 11.5–15.5)
WBC: 5 10*3/uL (ref 4.0–10.5)

## 2014-05-27 LAB — URINE MICROSCOPIC-ADD ON

## 2014-05-27 LAB — URINALYSIS, ROUTINE W REFLEX MICROSCOPIC
Bilirubin Urine: NEGATIVE
GLUCOSE, UA: NEGATIVE mg/dL
KETONES UR: NEGATIVE mg/dL
Leukocytes, UA: NEGATIVE
Nitrite: NEGATIVE
PROTEIN: NEGATIVE mg/dL
Specific Gravity, Urine: 1.01 (ref 1.005–1.030)
Urobilinogen, UA: 0.2 mg/dL (ref 0.0–1.0)
pH: 7.5 (ref 5.0–8.0)

## 2014-05-27 LAB — HCG, QUANTITATIVE, PREGNANCY: hCG, Beta Chain, Quant, S: <1 m[IU]/mL (ref <5)

## 2014-05-27 LAB — WET PREP, GENITAL
Trich, Wet Prep: NONE SEEN
Yeast Wet Prep HPF POC: NONE SEEN

## 2014-05-27 LAB — POCT PREGNANCY, URINE: PREG TEST UR: NEGATIVE

## 2014-05-27 LAB — HIV ANTIBODY (ROUTINE TESTING W REFLEX): HIV: NONREACTIVE

## 2014-05-27 MED ORDER — KETOROLAC TROMETHAMINE 60 MG/2ML IM SOLN
60.0000 mg | Freq: Once | INTRAMUSCULAR | Status: AC
  Administered 2014-05-27: 60 mg via INTRAMUSCULAR
  Filled 2014-05-27: qty 2

## 2014-05-27 MED ORDER — METRONIDAZOLE 500 MG PO TABS: 500.0000 mg | ORAL_TABLET | Freq: Two times a day (BID) | ORAL | Status: DC

## 2014-05-27 MED ORDER — MEDROXYPROGESTERONE ACETATE 150 MG/ML IM SUSP
150.0000 mg | Freq: Once | INTRAMUSCULAR | Status: AC
  Administered 2014-05-27: 150 mg via INTRAMUSCULAR
  Filled 2014-05-27: qty 1

## 2014-05-27 NOTE — Discharge Instructions (Signed)
Bacterial Vaginosis Bacterial vaginosis is a vaginal infection that occurs when the normal balance of bacteria in the vagina is disrupted. It results from an overgrowth of certain bacteria. This is the most common vaginal infection in women of childbearing age. Treatment is important to prevent complications, especially in pregnant women, as it can cause a premature delivery. CAUSES  Bacterial vaginosis is caused by an increase in harmful bacteria that are normally present in smaller amounts in the vagina. Several different kinds of bacteria can cause bacterial vaginosis. However, the reason that the condition develops is not fully understood. RISK FACTORS Certain activities or behaviors can put you at an increased risk of developing bacterial vaginosis, including:  Having a new sex partner or multiple sex partners.  Douching.  Using an intrauterine device (IUD) for contraception. Women do not get bacterial vaginosis from toilet seats, bedding, swimming pools, or contact with objects around them. SIGNS AND SYMPTOMS  Some women with bacterial vaginosis have no signs or symptoms. Common symptoms include:  Grey vaginal discharge.  A fishlike odor with discharge, especially after sexual intercourse.  Itching or burning of the vagina and vulva.  Burning or pain with urination. DIAGNOSIS  Your health care provider will take a medical history and examine the vagina for signs of bacterial vaginosis. A sample of vaginal fluid may be taken. Your health care provider will look at this sample under a microscope to check for bacteria and abnormal cells. A vaginal pH test may also be done.  TREATMENT  Bacterial vaginosis may be treated with antibiotic medicines. These may be given in the form of a pill or a vaginal cream. A second round of antibiotics may be prescribed if the condition comes back after treatment.  HOME CARE INSTRUCTIONS   Only take over-the-counter or prescription medicines as  directed by your health care provider.  If antibiotic medicine was prescribed, take it as directed. Make sure you finish it even if you start to feel better.  Do not have sex until treatment is completed.  Tell all sexual partners that you have a vaginal infection. They should see their health care provider and be treated if they have problems, such as a mild rash or itching.  Practice safe sex by using condoms and only having one sex partner. SEEK MEDICAL CARE IF:   Your symptoms are not improving after 3 days of treatment.  You have increased discharge or pain.  You have a fever. MAKE SURE YOU:   Understand these instructions.  Will watch your condition.  Will get help right away if you are not doing well or get worse. FOR MORE INFORMATION  Centers for Disease Control and Prevention, Division of STD Prevention: www.cdc.gov/std American Sexual Health Association (ASHA): www.ashastd.org  Document Released: 09/18/2005 Document Revised: 07/09/2013 Document Reviewed: 04/30/2013 ExitCare Patient Information 2015 [employer]. This information is not intended to replace advice given to you by your health care provider. Make sure you discuss any questions you have with your health care provider.  

## 2014-05-27 NOTE — MAU Note (Signed)
Patient states she was scheduled for her Depo tomorrow, last one was in May. States she had a "slightly positive" home pregnancy test about 2 weeks ago. Was negative in her MD office in [place] this am. States she has had irregular bleeding for about the past 4 weeks, brown spotting today. Has had abdominal pain off and on for the past 2 weeks. Nausea, with vomiting.

## 2014-05-27 NOTE — MAU Provider Note (Signed)
History     CSN: [id number]  Arrival date and time: 05/27/14 1433   None     Chief Complaint  Patient presents with  . Possible Pregnancy  . Nausea  . Abdominal Pain   HPI  Ms. Sara Fletcher is a 23 y.o. female G1P1001 who presents for a pregnancy test along with abdominal pain. The patient was due to get depo and had a faint positive pregnancy test at home. She wanted to be certain of the pregnancy test before she took depo. The abdominal pain started 3 weeks ago; the pain is everyday. The pain is located in the lower-mid part of her stomach and at times she has to ball up to keep the pressure off of her lower stomach.   OB History   Grav Para Term Preterm Abortions TAB SAB Ect Mult Living   Past Medical History  Diagnosis Date  . Vitamin deficiency   . Asthma     no problem in past 4 years  . Anxiety   . Mental disorder   . Pneumonia     Hospitalized in [facility] 2013    Past Surgical History  Procedure Laterality Date  . Wisdom tooth extraction    . Cholecystectomy N/A 08/20/2013    Procedure: LAPAROSCOPIC CHOLECYSTECTOMY WITH POSSIBLE CHOLANGIOGRAM;  Surgeon: Shelly Rubenstein, MD;  Location: [facility] OR;  Service: General;  Laterality: N/A;    Family History  Problem Relation Age of Onset  . Hypertension Mother   . Kidney disease Mother     recurring kidney stones    History  Substance Use Topics  . Smoking status: Current Every Day Smoker -- 3 years    Last Attempt to Quit: 07/31/2013  . Smokeless tobacco: Never Used  . Alcohol Use: No     Comment: occasional    Allergies: No Known Allergies  No prescriptions prior to admission   Results for orders placed during the hospital encounter of 05/27/14 (from the past 48 hour(s))  URINALYSIS, ROUTINE W REFLEX MICROSCOPIC     Status: Abnormal   Collection Time    05/27/14  2:37 PM      Result Value Ref Range   Color, Urine YELLOW  YELLOW   APPearance CLEAR  CLEAR   Specific  Gravity, Urine 1.010  1.005 - 1.030   pH 7.5  5.0 - 8.0   Glucose, UA NEGATIVE  NEGATIVE mg/dL   Hgb urine dipstick TRACE (*) NEGATIVE   Bilirubin Urine NEGATIVE  NEGATIVE   Ketones, ur NEGATIVE  NEGATIVE mg/dL   Protein, ur NEGATIVE  NEGATIVE mg/dL   Urobilinogen, UA 0.2  0.0 - 1.0 mg/dL   Nitrite NEGATIVE  NEGATIVE   Leukocytes, UA NEGATIVE  NEGATIVE  URINE MICROSCOPIC-ADD ON     Status: Abnormal   Collection Time    05/27/14  2:37 PM      Result Value Ref Range   Squamous Epithelial / LPF RARE  RARE   WBC, UA 0-2  <3 WBC/hpf   Bacteria, UA FEW (*) RARE   Urine-Other MUCOUS PRESENT     Comment: AMORPHOUS URATES/PHOSPHATES  POCT PREGNANCY, URINE     Status: None   Collection Time    05/27/14  3:05 PM      Result Value Ref Range   Preg Test, Ur NEGATIVE  NEGATIVE   Comment:  THE SENSITIVITY OF THIS     METHODOLOGY IS >24 mIU/mL  HCG, QUANTITATIVE, PREGNANCY     Status: None   Collection Time    05/27/14  4:18 PM      Result Value Ref Range   hCG, Beta Chain, Quant, S <1  <5 mIU/mL   Comment:              GEST. AGE      CONC.  (mIU/mL)       <=1 WEEK        5 - 50         2 WEEKS       50 - 500         3 WEEKS       100 - 10,000         4 WEEKS     1,000 - 30,000         5 WEEKS     3,500 - 115,000       6-8 WEEKS     12,000 - 270,000        12 WEEKS     15,000 - 220,000                FEMALE AND NON-PREGNANT FEMALE:         LESS THAN 5 mIU/mL     REPEATED TO VERIFY  CBC     Status: Abnormal   Collection Time    05/27/14  4:24 PM      Result Value Ref Range   WBC 5.0  4.0 - 10.5 K/uL   RBC 4.13  3.87 - 5.11 MIL/uL   Hemoglobin 13.3  12.0 - 15.0 g/dL   HCT 16.1  09.6 - 04.5 %   MCV 95.6  78.0 - 100.0 fL   MCH 32.2  26.0 - 34.0 pg   MCHC 33.7  30.0 - 36.0 g/dL   RDW 40.9  81.1 - 91.4 %   Platelets 129 (*) 150 - 400 K/uL  WET PREP, GENITAL     Status: Abnormal   Collection Time    05/27/14  5:52 PM      Result Value Ref Range   Yeast Wet Prep HPF POC  NONE SEEN  NONE SEEN   Trich, Wet Prep NONE SEEN  NONE SEEN   Clue Cells Wet Prep HPF POC FEW (*) NONE SEEN   WBC, Wet Prep HPF POC FEW (*) NONE SEEN   Comment: MODERATE BACTERIA SEEN    Review of Systems  Constitutional: Negative for fever and chills.  Gastrointestinal: Positive for nausea, vomiting, abdominal pain, diarrhea and constipation.  Genitourinary: Negative for dysuria, urgency, frequency and hematuria.       No vaginal discharge. No vaginal bleeding. No dysuria.    Physical Exam   Blood pressure 120/61, pulse 68, temperature 99.1 F (37.3 C), temperature source Oral, resp. rate 16, height  (1.778 m), weight 56.609 kg (124 lb 12.8 oz), SpO2 100.00%.  Physical Exam  Constitutional: She is oriented to person, place, and time. She appears well-developed and well-nourished. No distress.  HENT:  Head: Normocephalic.  Eyes: Pupils are equal, round, and reactive to light.  Neck: Neck supple.  Respiratory: Effort normal.  GI: Soft. Normal appearance. There is tenderness in the right lower quadrant, suprapubic area and left lower quadrant. There is no rigidity, no rebound and no guarding.  Genitourinary:  Speculum exam: Vagina - Small amount of creamy, brown discharge, no odor Cervix -  No contact bleeding, no active bleeding  Bimanual exam: Cervix closed, no CMT Uterus non tender, normal size Adnexa non tender, no masses bilaterally GC/Chlam, wet prep done Chaperone present for exam.   Neurological: She is alert and oriented to person, place, and time.  Skin: Skin is warm. She is not diaphoretic.  Psychiatric: Her behavior is normal.    MAU Course  Procedures None  MDM UA Hcg Wet prep GC-pending toradol Depo provera given in MAU  Urine culture.   Assessment and Plan   A:  1. BV (bacterial vaginosis)    P:  Discharge home in stable condition RX Flagyl  Return to MAU for emergencies  Follow up with PCP  Debbrah Alar, NP 05/27/2014,  5:16 PM

## 2014-05-28 LAB — GC/CHLAMYDIA PROBE AMP
CT PROBE, AMP APTIMA: NEGATIVE
GC PROBE AMP APTIMA: NEGATIVE

## 2014-05-29 LAB — URINE CULTURE
COLONY COUNT: NO GROWTH
Culture: NO GROWTH

## 2014-08-03 ENCOUNTER — Encounter (HOSPITAL_COMMUNITY): Payer: Self-pay

## 2017-05-17 DIAGNOSIS — K921 Melena: Secondary | ICD-10-CM | POA: Diagnosis not present

## 2017-05-17 DIAGNOSIS — R946 Abnormal results of thyroid function studies: Secondary | ICD-10-CM | POA: Diagnosis not present

## 2017-05-17 DIAGNOSIS — R635 Abnormal weight gain: Secondary | ICD-10-CM | POA: Diagnosis not present

## 2017-05-17 DIAGNOSIS — Z681 Body mass index (BMI) 19 or less, adult: Secondary | ICD-10-CM | POA: Diagnosis not present

## 2017-05-24 DIAGNOSIS — Z6821 Body mass index (BMI) 21.0-21.9, adult: Secondary | ICD-10-CM | POA: Diagnosis not present

## 2017-05-24 DIAGNOSIS — F419 Anxiety disorder, unspecified: Secondary | ICD-10-CM | POA: Diagnosis not present

## 2017-05-24 DIAGNOSIS — R635 Abnormal weight gain: Secondary | ICD-10-CM | POA: Diagnosis not present

## 2017-05-24 DIAGNOSIS — R946 Abnormal results of thyroid function studies: Secondary | ICD-10-CM | POA: Diagnosis not present

## 2017-07-05 ENCOUNTER — Ambulatory Visit (INDEPENDENT_AMBULATORY_CARE_PROVIDER_SITE_OTHER): Payer: BLUE CROSS/BLUE SHIELD

## 2017-07-05 ENCOUNTER — Ambulatory Visit (INDEPENDENT_AMBULATORY_CARE_PROVIDER_SITE_OTHER): Payer: BLUE CROSS/BLUE SHIELD | Admitting: Physician Assistant

## 2017-07-05 ENCOUNTER — Encounter: Payer: Self-pay | Admitting: Physician Assistant

## 2017-07-05 ENCOUNTER — Telehealth: Payer: Self-pay | Admitting: Family Medicine

## 2017-07-05 VITALS — BP 118/80 | HR 89 | Temp 98.5°F | Resp 16 | Ht 70.0 in | Wt 157.0 lb

## 2017-07-05 DIAGNOSIS — R109 Unspecified abdominal pain: Secondary | ICD-10-CM

## 2017-07-05 DIAGNOSIS — K5900 Constipation, unspecified: Secondary | ICD-10-CM

## 2017-07-05 DIAGNOSIS — R198 Other specified symptoms and signs involving the digestive system and abdomen: Secondary | ICD-10-CM | POA: Diagnosis not present

## 2017-07-05 DIAGNOSIS — R1084 Generalized abdominal pain: Secondary | ICD-10-CM | POA: Diagnosis not present

## 2017-07-05 DIAGNOSIS — R140 Abdominal distension (gaseous): Secondary | ICD-10-CM | POA: Diagnosis not present

## 2017-07-05 LAB — POCT URINALYSIS DIP (MANUAL ENTRY)
Bilirubin, UA: NEGATIVE
Blood, UA: NEGATIVE
Glucose, UA: NEGATIVE mg/dL
Ketones, POC UA: NEGATIVE mg/dL
Leukocytes, UA: NEGATIVE
Nitrite, UA: NEGATIVE
Protein Ur, POC: NEGATIVE mg/dL
SPEC GRAV UA: 1.02 (ref 1.010–1.025)
UROBILINOGEN UA: 0.2 U/dL
pH, UA: 7 (ref 5.0–8.0)

## 2017-07-05 LAB — POC MICROSCOPIC URINALYSIS (UMFC): Mucus: ABSENT

## 2017-07-05 LAB — POCT CBC
Granulocyte percent: 64.9 %G (ref 37–80)
HEMATOCRIT: 41.9 % (ref 37.7–47.9)
HEMOGLOBIN: 14.1 g/dL (ref 12.2–16.2)
Lymph, poc: 1.4 (ref 0.6–3.4)
MCH, POC: 31.7 pg — AB (ref 27–31.2)
MCHC: 33.7 g/dL (ref 31.8–35.4)
MCV: 94 fL (ref 80–97)
MID (cbc): 0.4 (ref 0–0.9)
MPV: 7.9 fL (ref 0–99.8)
POC GRANULOCYTE: 3.2 (ref 2–6.9)
POC LYMPH PERCENT: 27.5 %L (ref 10–50)
POC MID %: 7.6 %M (ref 0–12)
Platelet Count, POC: 153 10*3/uL (ref 142–424)
RBC: 4.45 M/uL (ref 4.04–5.48)
RDW, POC: 12.4 %
WBC: 5 10*3/uL (ref 4.6–10.2)

## 2017-07-05 LAB — POCT URINE PREGNANCY: Preg Test, Ur: NEGATIVE

## 2017-07-05 MED ORDER — POLYETHYLENE GLYCOL 3350 17 GM/SCOOP PO POWD: 17.0000 g | Freq: Two times a day (BID) | ORAL | 1 refills | Status: DC | PRN

## 2017-07-05 MED ORDER — DILTIAZEM GEL 2 %: 1.0000 "application " | Freq: Two times a day (BID) | CUTANEOUS | 0 refills | Status: DC

## 2017-07-05 NOTE — Progress Notes (Signed)
PRIMARY CARE AT [facility] [address], [place] [address] [phone]  Date:  07/05/2017   Name:  Sara Fletcher   DOB:  [birth year]   MRN:  [id number]  PCP:  Sara Blase, PA-C    History of Present Illness:  Sara Fletcher is a 26 y.o. female patient who presents to PCP with  Chief Complaint  Patient presents with  . Back Pain    lower/pain is going around to stomach  . Menstrual Problem    pt gets to periods a month     This morning, she woke up with intense pain of her lower right side of her back and flanki, radiating to her right groin.  hnurts to drive or move.   Cold chills.  Mild nausea this morning, wich has temporarily resolved.  No urinary pain, hematuria, or frequency.  No trauma, or heavy lifting.   Menses is ever 2 weeks.   seh ahs had hx of recurrent uti, and kidney disease She has  She has had abdominal pain.   She is to follow up with gynecologist for this.  No abnormal vaginal discharge She has gained 30lb in the last 3 months.  She does not report any change in her diet.   She feels that she is developing hirsutism.   Dyspareunia   Patient Active Problem List   Diagnosis Date Noted  . Symptomatic cholelithiasis 08/11/2013    Past Medical History:  Diagnosis Date  . Anxiety   . Asthma    no problem in past 4 years  . Mental disorder   . Pneumonia    Hospitalized in [facility] 2013  . Vitamin deficiency     Past Surgical History:  Procedure Laterality Date  . CHOLECYSTECTOMY N/A 08/20/2013   Procedure: LAPAROSCOPIC CHOLECYSTECTOMY WITH POSSIBLE CHOLANGIOGRAM;  Surgeon: Sara Rubenstein, MD;  Location: [facility] OR;  Service: General;  Laterality: N/A;  . WISDOM TOOTH EXTRACTION      Social History  Substance Use Topics  . Smoking status: Current Every Day Smoker    Years: 3.00    Last attempt to quit: 07/31/2013  . Smokeless tobacco: Never Used  . Alcohol use No     Comment: occasional    Family History  Problem Relation Age of  Onset  . Hypertension Mother   . Kidney disease Mother        recurring kidney stones    No Known Allergies  Medication list has been reviewed and updated.  Current Outpatient Prescriptions on File Prior to Visit  Medication Sig Dispense Refill  . metroNIDAZOLE (FLAGYL) 500 MG tablet Take 1 tablet (500 mg total) by mouth 2 (two) times daily. (Patient not taking: Reported on 07/05/2017) 14 tablet 0   No current facility-administered medications on file prior to visit.     ROS ROS otherwise unremarkable unless listed above.  Physical Examination: BP 118/80   Pulse 89   Temp 98.5 F (36.9 C) (Oral)   Resp 16   Ht  (1.778 m)   Wt 157 lb (71.2 kg)   LMP 06/23/2017   SpO2 100%   BMI 22.53 kg/m  Ideal Body Weight: Weight in (lb) to have BMI = 25: 173.9  Physical Exam  Constitutional: She is oriented to person, place, and time. She appears well-developed and well-nourished. No distress.  HENT:  Head: Normocephalic and atraumatic.  Right Ear: External ear normal.  Left Ear: External ear normal.  Eyes: Pupils are equal, round, and reactive to light.  Conjunctivae and EOM are normal.  Cardiovascular: Normal rate and regular rhythm.  Exam reveals no friction rub.   No murmur heard. Pulmonary/Chest: Effort normal. No respiratory distress.  Abdominal: Soft. Bowel sounds are normal. There is tenderness in the right lower quadrant and periumbilical area.  Genitourinary: There is no rash on the right labia. There is no rash on the left labia.  Genitourinary Comments: Anus appears normal with possible 12 oclock fissure though this could be normal anatomy.  Mildly tender at that location.   No hemorrhoid appearance.  Standing position.   Neurological: She is alert and oriented to person, place, and time.  Skin: She is not diaphoretic.  Psychiatric: She has a normal mood and affect. Her behavior is normal.    Results for orders placed or performed in visit on 07/05/17  POCT  urinalysis dipstick  Result Value Ref Range   Color, UA yellow yellow   Clarity, UA clear clear   Glucose, UA negative negative mg/dL   Bilirubin, UA negative negative   Ketones, POC UA negative negative mg/dL   Spec Grav, UA 4.098 1.191 - 1.025   Blood, UA negative negative   pH, UA 7.0 5.0 - 8.0   Protein Ur, POC negative negative mg/dL   Urobilinogen, UA 0.2 0.2 or 1.0 E.U./dL   Nitrite, UA Negative Negative   Leukocytes, UA Negative Negative  POCT urine pregnancy  Result Value Ref Range   Preg Test, Ur Negative Negative  POCT Microscopic Urinalysis ([facility])  Result Value Ref Range   WBC,UR,HPF,POC None None WBC/hpf   RBC,UR,HPF,POC Moderate (A) None RBC/hpf   Bacteria None None, Too numerous to count   Mucus Absent Absent   Epithelial Cells, UR Per Microscopy Moderate (A) None, Too numerous to count cells/hpf  POCT CBC  Result Value Ref Range   WBC 5.0 4.6 - 10.2 K/uL   Lymph, poc 1.4 0.6 - 3.4   POC LYMPH PERCENT 27.5 10 - 50 %L   MID (cbc) 0.4 0 - 0.9   POC MID % 7.6 0 - 12 %M   POC Granulocyte 3.2 2 - 6.9   Granulocyte percent 64.9 37 - 80 %G   RBC 4.45 4.04 - 5.48 M/uL   Hemoglobin 14.1 12.2 - 16.2 g/dL   HCT, POC 47.8 29.5 - 47.9 %   MCV 94.0 80 - 97 fL   MCH, POC 31.7 (A) 27 - 31.2 pg   MCHC 33.7 31.8 - 35.4 g/dL   RDW, POC 62.1 %   Platelet Count, POC 153 142 - 424 K/uL   MPV 7.9 0 - 99.8 fL   Dg Abd 1 View  Result Date: 07/05/2017 CLINICAL DATA:  Right-sided abdominal pain EXAM: ABDOMEN - 1 VIEW COMPARISON:  None. FINDINGS: Scattered large and small bowel gas is noted. No abnormal mass or abnormal calculi are seen. No free air is noted. No bony abnormality is seen. Changes of prior cholecystectomy are noted. IMPRESSION: No acute abnormality noted. Electronically Signed   By: Sara Fletcher M.D.   On: 07/05/2017 11:21   Assessment and Plan: Sara Fletcher is a 26 y.o. female who is here today for right lower quadrant and side pain. This appears to be likely  bowel gas.  Advised miralax, and increase fiber diet.  She will follow up as needed.  She was also advised to follow up with gynecologist which she plans within 1 week. She would like to decline blood work at this time.   Starting her on  nifedipine for the possible anal fissures.     Constipation, unspecified constipation type - Plan: polyethylene glycol powder (GLYCOLAX/MIRALAX) powder  Generalized abdominal pain - Plan: POCT urinalysis dipstick, POCT urine pregnancy, POCT Microscopic Urinalysis ([facility]), POCT CBC, DG Abd 1 View  Flank pain - Plan: POCT urine pregnancy, POCT Microscopic Urinalysis ([facility]), POCT CBC, DG Abd 1 View  Pain with bowel movements  Trena Platt, PA-C [facility] 10/8/20182:34 PM

## 2017-07-05 NOTE — Patient Instructions (Signed)
Do not take the miralax twice per day no more than 1 week.  You can then take it once per day.   Constipation, Adult Constipation is when a person:  Poops (has a bowel movement) fewer times in a week than normal.  Has a hard time pooping.  Has poop that is dry, hard, or bigger than normal.  Follow these instructions at home: Eating and drinking   Eat foods that have a lot of fiber, such as: ? Fresh fruits and vegetables. ? Whole grains. ? Beans.  Eat less of foods that are high in fat, low in fiber, or overly processed, such as: ? [place] fries. ? Hamburgers. ? Cookies. ? Candy. ? Soda.  Drink enough fluid to keep your pee (urine) clear or pale yellow. General instructions  Exercise regularly or as told by your doctor.  Go to the restroom when you feel like you need to poop. Do not hold it in.  Take over-the-counter and prescription medicines only as told by your doctor. These include any fiber supplements.  Do pelvic floor retraining exercises, such as: ? Doing deep breathing while relaxing your lower belly (abdomen). ? Relaxing your pelvic floor while pooping.  Watch your condition for any changes.  Keep all follow-up visits as told by your doctor. This is important. Contact a doctor if:  You have pain that gets worse.  You have a fever.  You have not pooped for 4 days.  You throw up (vomit).  You are not hungry.  You lose weight.  You are bleeding from the anus.  You have thin, pencil-like poop (stool). Get help right away if:  You have a fever, and your symptoms suddenly get worse.  You leak poop or have blood in your poop.  Your belly feels hard or bigger than normal (is bloated).  You have very bad belly pain.  You feel dizzy or you faint. This information is not intended to replace advice given to you by your health care provider. Make sure you discuss any questions you have with your health care provider. Document Released: 03/06/2008  Document Revised: 04/07/2016 Document Reviewed: 03/08/2016 Elsevier Interactive Patient Education  2017 [employer].

## 2017-07-06 NOTE — Telephone Encounter (Signed)
Pt advised.

## 2017-07-06 NOTE — Telephone Encounter (Signed)
I contacted the pharmacy to see what was going on.  Please advise that she needs to take this to a compounding place like [facility] in [place].  If she lives in [place], she can bring it to [facility] (i contacted to confirm)--and whatever compounding pharmacy you may know of.  Make sure she increases her fiber as well as discussed.

## 2017-07-09 DIAGNOSIS — N926 Irregular menstruation, unspecified: Secondary | ICD-10-CM | POA: Diagnosis not present

## 2017-07-09 DIAGNOSIS — Z124 Encounter for screening for malignant neoplasm of cervix: Secondary | ICD-10-CM | POA: Diagnosis not present

## 2017-07-09 DIAGNOSIS — Z6822 Body mass index (BMI) 22.0-22.9, adult: Secondary | ICD-10-CM | POA: Diagnosis not present

## 2017-07-09 DIAGNOSIS — Z01419 Encounter for gynecological examination (general) (routine) without abnormal findings: Secondary | ICD-10-CM | POA: Diagnosis not present

## 2017-07-23 DIAGNOSIS — N926 Irregular menstruation, unspecified: Secondary | ICD-10-CM | POA: Diagnosis not present

## 2017-07-23 DIAGNOSIS — Z6822 Body mass index (BMI) 22.0-22.9, adult: Secondary | ICD-10-CM | POA: Diagnosis not present

## 2017-08-13 ENCOUNTER — Telehealth: Payer: Self-pay | Admitting: Family Medicine

## 2017-08-13 NOTE — Telephone Encounter (Signed)
Please see attached message

## 2017-08-13 NOTE — Telephone Encounter (Signed)
Copied from CRM [phone]. Topic: Quick Communication - See Telephone Encounter >> Aug 13, 2017 10:06 AM Jolayne Hainesaylor, Brittany L wrote: CRM for notification. See Telephone encounter for:  Pharmacist Issac from [facility]clled and the script was wrote for diltiazem 2% its acompounded protec. Wants to make the sure the oinment is ok. Patient is having a hard time finding it. You can call him back @ [phone]. Fax # [phone] She needs this changed please and [facility].  08/13/17.

## 2017-08-14 NOTE — Telephone Encounter (Signed)
Informed pharmacy to switch to oinment

## 2017-08-20 DIAGNOSIS — N912 Amenorrhea, unspecified: Secondary | ICD-10-CM | POA: Diagnosis not present

## 2017-09-17 DIAGNOSIS — Z6823 Body mass index (BMI) 23.0-23.9, adult: Secondary | ICD-10-CM | POA: Diagnosis not present

## 2017-09-17 DIAGNOSIS — Z3480 Encounter for supervision of other normal pregnancy, unspecified trimester: Secondary | ICD-10-CM | POA: Diagnosis not present

## 2017-09-17 DIAGNOSIS — N925 Other specified irregular menstruation: Secondary | ICD-10-CM | POA: Diagnosis not present

## 2017-10-02 NOTE — L&D Delivery Note (Signed)
Patient was C/C/+3 and pushed for 5 minutes with epidural.    NSVD  female infant, Apgars 9,9, weight P.   The patient had a second degree midline perineal laceration repaired with 2-0 vicryl. Fundus was firm. EBL was expected amount. Placenta was delivered intact. Vagina was clear.  Delayed cord clamping done for 30-60 seconds while warming baby. Baby was vigorous and doing skin to skin with mother. Parents desire circ in hospital  Sara Fletcher A

## 2017-10-03 DIAGNOSIS — Z6823 Body mass index (BMI) 23.0-23.9, adult: Secondary | ICD-10-CM | POA: Diagnosis not present

## 2017-10-03 DIAGNOSIS — J069 Acute upper respiratory infection, unspecified: Secondary | ICD-10-CM | POA: Diagnosis not present

## 2017-10-12 DIAGNOSIS — Z3480 Encounter for supervision of other normal pregnancy, unspecified trimester: Secondary | ICD-10-CM | POA: Diagnosis not present

## 2017-10-12 DIAGNOSIS — Z3682 Encounter for antenatal screening for nuchal translucency: Secondary | ICD-10-CM | POA: Diagnosis not present

## 2017-10-12 LAB — OB RESULTS CONSOLE ABO/RH

## 2017-10-12 LAB — OB RESULTS CONSOLE RPR: RPR: NONREACTIVE

## 2017-10-12 LAB — OB RESULTS CONSOLE HEPATITIS B SURFACE ANTIGEN: HEP B S AG: NEGATIVE

## 2017-10-12 LAB — OB RESULTS CONSOLE HIV ANTIBODY (ROUTINE TESTING): HIV: NONREACTIVE

## 2017-10-12 LAB — OB RESULTS CONSOLE RUBELLA ANTIBODY, IGM: Rubella: IMMUNE

## 2017-10-12 LAB — OB RESULTS CONSOLE ANTIBODY SCREEN: Antibody Screen: NEGATIVE

## 2017-11-15 DIAGNOSIS — Z363 Encounter for antenatal screening for malformations: Secondary | ICD-10-CM | POA: Diagnosis not present

## 2017-12-05 DIAGNOSIS — N766 Ulceration of vulva: Secondary | ICD-10-CM | POA: Diagnosis not present

## 2017-12-05 LAB — OB RESULTS CONSOLE GC/CHLAMYDIA
CHLAMYDIA, DNA PROBE: NEGATIVE
Gonorrhea: NEGATIVE

## 2017-12-13 DIAGNOSIS — Z369 Encounter for antenatal screening, unspecified: Secondary | ICD-10-CM | POA: Diagnosis not present

## 2018-01-01 ENCOUNTER — Encounter: Payer: Self-pay | Admitting: Physician Assistant

## 2018-01-11 DIAGNOSIS — Z3480 Encounter for supervision of other normal pregnancy, unspecified trimester: Secondary | ICD-10-CM | POA: Diagnosis not present

## 2018-01-28 DIAGNOSIS — O360990 Maternal care for other rhesus isoimmunization, unspecified trimester, not applicable or unspecified: Secondary | ICD-10-CM | POA: Diagnosis not present

## 2018-01-28 DIAGNOSIS — N898 Other specified noninflammatory disorders of vagina: Secondary | ICD-10-CM | POA: Diagnosis not present

## 2018-02-12 DIAGNOSIS — Z23 Encounter for immunization: Secondary | ICD-10-CM | POA: Diagnosis not present

## 2018-02-15 ENCOUNTER — Encounter: Payer: Self-pay | Admitting: Family Medicine

## 2018-02-20 ENCOUNTER — Encounter: Payer: Self-pay | Admitting: Family Medicine

## 2018-03-19 DIAGNOSIS — Z369 Encounter for antenatal screening, unspecified: Secondary | ICD-10-CM | POA: Diagnosis not present

## 2018-03-20 DIAGNOSIS — Z3480 Encounter for supervision of other normal pregnancy, unspecified trimester: Secondary | ICD-10-CM | POA: Diagnosis not present

## 2018-03-20 LAB — OB RESULTS CONSOLE GBS: GBS: NEGATIVE

## 2018-04-02 DIAGNOSIS — O26843 Uterine size-date discrepancy, third trimester: Secondary | ICD-10-CM | POA: Diagnosis not present

## 2018-04-02 DIAGNOSIS — Z369 Encounter for antenatal screening, unspecified: Secondary | ICD-10-CM | POA: Diagnosis not present

## 2018-04-11 DIAGNOSIS — Z369 Encounter for antenatal screening, unspecified: Secondary | ICD-10-CM | POA: Diagnosis not present

## 2018-04-16 ENCOUNTER — Other Ambulatory Visit: Payer: Self-pay | Admitting: Obstetrics and Gynecology

## 2018-04-16 ENCOUNTER — Inpatient Hospital Stay (HOSPITAL_COMMUNITY)
Admission: AD | Admit: 2018-04-16 | Discharge: 2018-04-18 | DRG: 806 | Disposition: A | Discharge status: Home / Self Care | Payer: BLUE CROSS/BLUE SHIELD | Attending: Obstetrics and Gynecology | Admitting: Obstetrics and Gynecology

## 2018-04-16 ENCOUNTER — Encounter (HOSPITAL_COMMUNITY): Payer: Self-pay | Admitting: *Deleted

## 2018-04-16 ENCOUNTER — Other Ambulatory Visit: Payer: Self-pay

## 2018-04-16 ENCOUNTER — Inpatient Hospital Stay (HOSPITAL_COMMUNITY): Payer: BLUE CROSS/BLUE SHIELD | Admitting: Anesthesiology

## 2018-04-16 DIAGNOSIS — Z3A39 39 weeks gestation of pregnancy: Secondary | ICD-10-CM | POA: Diagnosis not present

## 2018-04-16 DIAGNOSIS — Z6791 Unspecified blood type, Rh negative: Secondary | ICD-10-CM | POA: Diagnosis not present

## 2018-04-16 DIAGNOSIS — Z831 Family history of other infectious and parasitic diseases: Secondary | ICD-10-CM | POA: Diagnosis not present

## 2018-04-16 DIAGNOSIS — O99334 Smoking (tobacco) complicating childbirth: Secondary | ICD-10-CM | POA: Diagnosis not present

## 2018-04-16 DIAGNOSIS — Z3490 Encounter for supervision of normal pregnancy, unspecified, unspecified trimester: Secondary | ICD-10-CM | POA: Diagnosis present

## 2018-04-16 DIAGNOSIS — O9832 Other infections with a predominantly sexual mode of transmission complicating childbirth: Secondary | ICD-10-CM | POA: Diagnosis present

## 2018-04-16 DIAGNOSIS — Z812 Family history of tobacco abuse and dependence: Secondary | ICD-10-CM | POA: Diagnosis not present

## 2018-04-16 DIAGNOSIS — O3663X0 Maternal care for excessive fetal growth, third trimester, not applicable or unspecified: Principal | ICD-10-CM | POA: Diagnosis present

## 2018-04-16 DIAGNOSIS — Q828 Other specified congenital malformations of skin: Secondary | ICD-10-CM | POA: Diagnosis not present

## 2018-04-16 DIAGNOSIS — Z23 Encounter for immunization: Secondary | ICD-10-CM | POA: Diagnosis not present

## 2018-04-16 DIAGNOSIS — O26893 Other specified pregnancy related conditions, third trimester: Secondary | ICD-10-CM | POA: Diagnosis not present

## 2018-04-16 DIAGNOSIS — A6000 Herpesviral infection of urogenital system, unspecified: Secondary | ICD-10-CM | POA: Diagnosis not present

## 2018-04-16 DIAGNOSIS — F17200 Nicotine dependence, unspecified, uncomplicated: Secondary | ICD-10-CM | POA: Diagnosis present

## 2018-04-16 DIAGNOSIS — Z818 Family history of other mental and behavioral disorders: Secondary | ICD-10-CM | POA: Diagnosis not present

## 2018-04-16 LAB — CBC
HCT: 36.7 % (ref 36.0–46.0)
Hemoglobin: 12 g/dL (ref 12.0–15.0)
MCH: 32 pg (ref 26.0–34.0)
MCHC: 32.7 g/dL (ref 30.0–36.0)
MCV: 97.9 fL (ref 78.0–100.0)
PLATELETS: 187 10*3/uL (ref 150–400)
RBC: 3.75 MIL/uL — AB (ref 3.87–5.11)
RDW: 16 % — ABNORMAL HIGH (ref 11.5–15.5)
WBC: 7.2 10*3/uL (ref 4.0–10.5)

## 2018-04-16 MED ORDER — ACETAMINOPHEN 325 MG PO TABS
650.0000 mg | ORAL_TABLET | ORAL | Status: DC | PRN
  Administered 2018-04-17: 650 mg via ORAL
  Filled 2018-04-16: qty 2

## 2018-04-16 MED ORDER — OXYTOCIN BOLUS FROM INFUSION: 500.0000 mL | Freq: Once | INTRAVENOUS | Status: DC

## 2018-04-16 MED ORDER — PHENYLEPHRINE 40 MCG/ML (10ML) SYRINGE FOR IV PUSH (FOR BLOOD PRESSURE SUPPORT)
80.0000 ug | PREFILLED_SYRINGE | INTRAVENOUS | Status: DC | PRN
  Filled 2018-04-16: qty 10
  Filled 2018-04-16: qty 5

## 2018-04-16 MED ORDER — OXYCODONE-ACETAMINOPHEN 5-325 MG PO TABS: 2.0000 | ORAL_TABLET | ORAL | Status: DC | PRN

## 2018-04-16 MED ORDER — SOD CITRATE-CITRIC ACID 500-334 MG/5ML PO SOLN: 30.0000 mL | ORAL | Status: DC | PRN

## 2018-04-16 MED ORDER — FLEET ENEMA 7-19 GM/118ML RE ENEM: 1.0000 | ENEMA | RECTAL | Status: DC | PRN

## 2018-04-16 MED ORDER — LIDOCAINE HCL (PF) 1 % IJ SOLN
30.0000 mL | INTRAMUSCULAR | Status: DC | PRN
  Filled 2018-04-16: qty 30

## 2018-04-16 MED ORDER — DIPHENHYDRAMINE HCL 50 MG/ML IJ SOLN
12.5000 mg | INTRAMUSCULAR | Status: DC | PRN
  Administered 2018-04-16: 12.5 mg via INTRAVENOUS
  Filled 2018-04-16: qty 1

## 2018-04-16 MED ORDER — PHENYLEPHRINE 40 MCG/ML (10ML) SYRINGE FOR IV PUSH (FOR BLOOD PRESSURE SUPPORT)
80.0000 ug | PREFILLED_SYRINGE | INTRAVENOUS | Status: DC | PRN
  Filled 2018-04-16: qty 5
  Filled 2018-04-16: qty 10

## 2018-04-16 MED ORDER — LACTATED RINGERS IV SOLN
500.0000 mL | Freq: Once | INTRAVENOUS | Status: AC
  Administered 2018-04-16: 500 mL via INTRAVENOUS

## 2018-04-16 MED ORDER — OXYCODONE-ACETAMINOPHEN 5-325 MG PO TABS: 1.0000 | ORAL_TABLET | ORAL | Status: DC | PRN

## 2018-04-16 MED ORDER — LACTATED RINGERS IV SOLN
INTRAVENOUS | Status: DC
  Administered 2018-04-16 (×3): via INTRAVENOUS

## 2018-04-16 MED ORDER — EPHEDRINE 5 MG/ML INJ
10.0000 mg | INTRAVENOUS | Status: DC | PRN
  Filled 2018-04-16: qty 2

## 2018-04-16 MED ORDER — LIDOCAINE HCL (PF) 1 % IJ SOLN
INTRAMUSCULAR | Status: DC | PRN
  Administered 2018-04-16 (×2): 5 mL via EPIDURAL

## 2018-04-16 MED ORDER — OXYTOCIN 40 UNITS IN LACTATED RINGERS INFUSION - SIMPLE MED
1.0000 m[IU]/min | INTRAVENOUS | Status: DC
  Administered 2018-04-16: 2 m[IU]/min via INTRAVENOUS
  Filled 2018-04-16: qty 1000

## 2018-04-16 MED ORDER — OXYTOCIN 40 UNITS IN LACTATED RINGERS INFUSION - SIMPLE MED: 2.5000 [IU]/h | INTRAVENOUS | Status: DC

## 2018-04-16 MED ORDER — TERBUTALINE SULFATE 1 MG/ML IJ SOLN
0.2500 mg | Freq: Once | INTRAMUSCULAR | Status: DC | PRN
  Filled 2018-04-16: qty 1

## 2018-04-16 MED ORDER — FENTANYL 2.5 MCG/ML BUPIVACAINE 1/10 % EPIDURAL INFUSION (WH - ANES)
14.0000 mL/h | INTRAMUSCULAR | Status: DC | PRN
  Administered 2018-04-16 (×2): 14 mL/h via EPIDURAL
  Filled 2018-04-16 (×2): qty 100

## 2018-04-16 MED ORDER — LACTATED RINGERS IV SOLN: 500.0000 mL | INTRAVENOUS | Status: DC | PRN

## 2018-04-16 MED ORDER — ONDANSETRON HCL 4 MG/2ML IJ SOLN
4.0000 mg | Freq: Four times a day (QID) | INTRAMUSCULAR | Status: DC | PRN
  Administered 2018-04-16: 4 mg via INTRAVENOUS
  Filled 2018-04-16: qty 2

## 2018-04-16 NOTE — Anesthesia Preprocedure Evaluation (Signed)
Anesthesia Evaluation  Patient identified by MRN, date of birth, ID band Patient awake    Reviewed: Allergy & Precautions, H&P , NPO status , Patient's Chart, lab work & pertinent test results  Airway Mallampati: II   Neck ROM: full    Dental   Pulmonary asthma , Current Smoker,    breath sounds clear to auscultation       Cardiovascular negative cardio ROS   Rhythm:regular Rate:Normal     Neuro/Psych PSYCHIATRIC DISORDERS Anxiety    GI/Hepatic   Endo/Other    Renal/GU      Musculoskeletal   Abdominal   Peds  Hematology   Anesthesia Other Findings   Reproductive/Obstetrics (+) Pregnancy                             Anesthesia Physical Anesthesia Plan  ASA: II  Anesthesia Plan: Epidural   Post-op Pain Management:    Induction: Intravenous  PONV Risk Score and Plan: 1 and Treatment may vary due to age or medical condition  Airway Management Planned: Natural Airway  Additional Equipment:   Intra-op Plan:   Post-operative Plan:   Informed Consent: I have reviewed the patients History and Physical, chart, labs and discussed the procedure including the risks, benefits and alternatives for the proposed anesthesia with the patient or authorized representative who has indicated his/her understanding and acceptance.     Plan Discussed with: Anesthesiologist  Anesthesia Plan Comments:         Anesthesia Quick Evaluation

## 2018-04-16 NOTE — Anesthesia Procedure Notes (Signed)
Epidural Patient location during procedure: OB Start time: 04/16/2018 1:42 PM End time: 04/16/2018 1:51 PM  Staffing Anesthesiologist: Achille RichHodierne, Timberly Yott, MD Performed: anesthesiologist   Preanesthetic Checklist Completed: patient identified, site marked, pre-op evaluation, timeout performed, IV checked, risks and benefits discussed and monitors and equipment checked  Epidural Patient position: sitting Prep: DuraPrep Patient monitoring: heart rate, cardiac monitor, continuous pulse ox and blood pressure Approach: midline Location: L2-L3 Injection technique: LOR saline  Needle:  Needle type: Tuohy  Needle gauge: 17 G Needle length: 9 cm Needle insertion depth: 6 cm Catheter type: closed end flexible Catheter size: 19 Gauge Catheter at skin depth: 12 cm Test dose: negative and Other  Assessment Events: blood not aspirated, injection not painful, no injection resistance and negative IV test  Additional Notes Informed consent obtained prior to proceeding including risk of failure, 1% risk of PDPH, risk of minor discomfort and bruising.  Discussed rare but serious complications including epidural abscess, permanent nerve injury, epidural hematoma.  Discussed alternatives to epidural analgesia and patient desires to proceed.  Timeout performed pre-procedure verifying patient name, procedure, and platelet count.  Patient tolerated procedure well. Reason for block:procedure for pain

## 2018-04-16 NOTE — Progress Notes (Unsigned)
27 y.o. Unknown  G1P1001 comes in for elective induction.  Otherwise has good fetal movement and no bleeding.  Past Medical History:  Diagnosis Date  . Anxiety   . Asthma    no problem in past 4 years  . Mental disorder   . Pneumonia    [facility]  . Vitamin deficiency     Past Surgical History:  Procedure Laterality Date  . CHOLECYSTECTOMY N/A 08/20/2013   Procedure: LAPAROSCOPIC CHOLECYSTECTOMY WITH POSSIBLE CHOLANGIOGRAM;  Surgeon: Sara Rubensteinouglas A Blackman, MD;  Location: [facility] OR;  Service: General;  Laterality: N/A;  . WISDOM TOOTH EXTRACTION      OB History  Gravida Para Term Preterm AB Living  1 1 1     1   SAB TAB Ectopic Multiple Live Births          1    # Outcome Date GA Lbr Len/2nd Weight Sex Delivery Anes PTL Lv  1 Term      Vag-Spont   LIV    Social History   Socioeconomic History  . Marital status: Single    Spouse name: Not on file  . Number of children: Not on file  . Years of education: Not on file  . Highest education level: Not on file  Occupational History  . Not on file  Social Needs  . Financial resource strain: Not on file  . Food insecurity:    Worry: Not on file    Inability: Not on file  . Transportation needs:    Medical: Not on file    Non-medical: Not on file  Tobacco Use  . Smoking status: Current Every Day Smoker    Years: 3.00    Last attempt to quit: 07/31/2013    Years since quitting: 4.7  . Smokeless tobacco: Never Used  Substance and Sexual Activity  . Alcohol use: No    Comment: occasional  . Drug use: No  . Sexual activity: Yes    Birth control/protection: Injection  Lifestyle  . Physical activity:    Days per week: Not on file    Minutes per session: Not on file  . Stress: Not on file  Relationships  . Social connections:    Talks on phone: Not on file    Gets together: Not on file    Attends religious service: Not on file    Active member of club or organization: Not on file    Attends  meetings of clubs or organizations: Not on file    Relationship status: Not on file  . Intimate partner violence:    Fear of current or ex partner: Not on file    Emotionally abused: Not on file    Physically abused: Not on file    Forced sexual activity: Not on file  Other Topics Concern  . Not on file  Social History Narrative  . Not on file   Patient has no known allergies.    Prenatal Transfer Tool  Maternal Diabetes: No Genetic Screening: NT <2, no FTS reported Maternal Ultrasounds/Referrals: Normal Fetal Ultrasounds or other Referrals:  None Maternal Substance Abuse:  No Significant Maternal Medications:  Meds include: Other: Valcyclovir for genital HSV Significant Maternal Lab Results: None  Other PNC: uncomplicated.  Last US EFW >90%, 8#13, 3991g.  Pt has hx of >8# first infant.     There were no vitals filed for this visit.  Lungs/Cor:  NAD Abdomen:  soft, gravid Ex:  no cords, erythema SVE:  ***  SSE: FHTs:  ***, good STV, NST R Toco:  q***   A/P   Term elecitve induction for LGA.  GBS neg.  Sara Fletcher A

## 2018-04-16 NOTE — Anesthesia Pain Management Evaluation Note (Signed)
  CRNA Pain Management Visit Note  Patient: Sara CrazierChelsea Fletcher, 27 y.o., female  "Hello I am a [facility] of the anesthesia team at [facility]. We have an anesthesia team available at all times to provide care throughout the hospital, including epidural management and anesthesia for C-section. I don't know your plan for the delivery whether it a natural birth, water birth, IV sedation, nitrous supplementation, doula or epidural, but we want to meet your pain goals."   1.Was your pain managed to your expectations on prior hospitalizations?   Yes   2.What is your expectation for pain management during this hospitalization?     Epidural  3.How can we help you reach that goal? Epidural when appropriate  Record the patient's initial score and the patient's pain goal.   Pain: 4  Pain Goal: 7 [facility] wants you to be able to say your pain was always managed very well.  [name], [name] 04/16/2018

## 2018-04-17 ENCOUNTER — Encounter (HOSPITAL_COMMUNITY): Payer: Self-pay

## 2018-04-17 LAB — CBC
HCT: 31.9 % — ABNORMAL LOW (ref 36.0–46.0)
Hemoglobin: 10.7 g/dL — ABNORMAL LOW (ref 12.0–15.0)
MCH: 32.2 pg (ref 26.0–34.0)
MCHC: 33.5 g/dL (ref 30.0–36.0)
MCV: 96.1 fL (ref 78.0–100.0)
PLATELETS: 178 10*3/uL (ref 150–400)
RBC: 3.32 MIL/uL — AB (ref 3.87–5.11)
RDW: 15.7 % — AB (ref 11.5–15.5)
WBC: 10.9 10*3/uL — ABNORMAL HIGH (ref 4.0–10.5)

## 2018-04-17 LAB — RPR: RPR: NONREACTIVE

## 2018-04-17 MED ORDER — MEASLES, MUMPS & RUBELLA VAC ~~LOC~~ INJ
0.5000 mL | INJECTION | Freq: Once | SUBCUTANEOUS | Status: DC
  Filled 2018-04-17: qty 0.5

## 2018-04-17 MED ORDER — METHYLERGONOVINE MALEATE 0.2 MG/ML IJ SOLN: 0.2000 mg | INTRAMUSCULAR | Status: DC | PRN

## 2018-04-17 MED ORDER — COCONUT OIL OIL
1.0000 "application " | TOPICAL_OIL | Status: DC | PRN
  Administered 2018-04-18: 1 via TOPICAL
  Filled 2018-04-17: qty 120

## 2018-04-17 MED ORDER — SIMETHICONE 80 MG PO CHEW: 80.0000 mg | CHEWABLE_TABLET | ORAL | Status: DC | PRN

## 2018-04-17 MED ORDER — DIPHENHYDRAMINE HCL 25 MG PO CAPS: 25.0000 mg | ORAL_CAPSULE | Freq: Four times a day (QID) | ORAL | Status: DC | PRN

## 2018-04-17 MED ORDER — PRENATAL MULTIVITAMIN CH
1.0000 | ORAL_TABLET | Freq: Every day | ORAL | Status: DC
  Administered 2018-04-17 – 2018-04-18 (×2): 1 via ORAL
  Filled 2018-04-17 (×2): qty 1

## 2018-04-17 MED ORDER — SODIUM CHLORIDE 0.9% FLUSH: 3.0000 mL | INTRAVENOUS | Status: DC | PRN

## 2018-04-17 MED ORDER — ACETAMINOPHEN 325 MG PO TABS
650.0000 mg | ORAL_TABLET | ORAL | Status: DC | PRN
  Administered 2018-04-17 – 2018-04-18 (×4): 650 mg via ORAL
  Filled 2018-04-17 (×4): qty 2

## 2018-04-17 MED ORDER — ZOLPIDEM TARTRATE 5 MG PO TABS: 5.0000 mg | ORAL_TABLET | Freq: Every evening | ORAL | Status: DC | PRN

## 2018-04-17 MED ORDER — ONDANSETRON HCL 4 MG PO TABS: 4.0000 mg | ORAL_TABLET | ORAL | Status: DC | PRN

## 2018-04-17 MED ORDER — SENNOSIDES-DOCUSATE SODIUM 8.6-50 MG PO TABS
2.0000 | ORAL_TABLET | ORAL | Status: DC
  Administered 2018-04-18: 2 via ORAL
  Filled 2018-04-17: qty 2

## 2018-04-17 MED ORDER — DIBUCAINE 1 % RE OINT: 1.0000 "application " | TOPICAL_OINTMENT | RECTAL | Status: DC | PRN

## 2018-04-17 MED ORDER — SODIUM CHLORIDE 0.9 % IV SOLN: 250.0000 mL | INTRAVENOUS | Status: DC | PRN

## 2018-04-17 MED ORDER — SODIUM CHLORIDE 0.9% FLUSH: 3.0000 mL | Freq: Two times a day (BID) | INTRAVENOUS | Status: DC

## 2018-04-17 MED ORDER — OXYCODONE-ACETAMINOPHEN 5-325 MG PO TABS: 2.0000 | ORAL_TABLET | ORAL | Status: DC | PRN

## 2018-04-17 MED ORDER — TETANUS-DIPHTH-ACELL PERTUSSIS 5-2.5-18.5 LF-MCG/0.5 IM SUSP: 0.5000 mL | Freq: Once | INTRAMUSCULAR | Status: DC

## 2018-04-17 MED ORDER — WITCH HAZEL-GLYCERIN EX PADS: 1.0000 "application " | MEDICATED_PAD | CUTANEOUS | Status: DC | PRN

## 2018-04-17 MED ORDER — ONDANSETRON HCL 4 MG/2ML IJ SOLN: 4.0000 mg | INTRAMUSCULAR | Status: DC | PRN

## 2018-04-17 MED ORDER — MAGNESIUM HYDROXIDE 400 MG/5ML PO SUSP: 30.0000 mL | ORAL | Status: DC | PRN

## 2018-04-17 MED ORDER — METHYLERGONOVINE MALEATE 0.2 MG PO TABS: 0.2000 mg | ORAL_TABLET | ORAL | Status: DC | PRN

## 2018-04-17 MED ORDER — FERROUS SULFATE 325 (65 FE) MG PO TABS
325.0000 mg | ORAL_TABLET | Freq: Two times a day (BID) | ORAL | Status: DC
  Administered 2018-04-17 – 2018-04-18 (×3): 325 mg via ORAL
  Filled 2018-04-17 (×3): qty 1

## 2018-04-17 MED ORDER — RHO D IMMUNE GLOBULIN 1500 UNIT/2ML IJ SOSY
300.0000 ug | PREFILLED_SYRINGE | Freq: Once | INTRAMUSCULAR | Status: AC
  Administered 2018-04-17: 300 ug via INTRAVENOUS
  Filled 2018-04-17: qty 2

## 2018-04-17 MED ORDER — BENZOCAINE-MENTHOL 20-0.5 % EX AERO
1.0000 "application " | INHALATION_SPRAY | CUTANEOUS | Status: DC | PRN
  Administered 2018-04-17: 1 via TOPICAL
  Filled 2018-04-17 (×2): qty 56

## 2018-04-17 MED ORDER — IBUPROFEN 800 MG PO TABS
800.0000 mg | ORAL_TABLET | Freq: Three times a day (TID) | ORAL | Status: DC
  Administered 2018-04-17 – 2018-04-18 (×4): 800 mg via ORAL
  Filled 2018-04-17 (×5): qty 1

## 2018-04-17 NOTE — Lactation Note (Signed)
This note was copied from a baby's chart. Lactation Consultation Note  Patient Name: Sara Fletcher ZOXWR'UToday's Date: 04/17/2018     [facility]  Mother sleeping; will attempt to return later this a.m.   [name] [name] 04/17/2018, 4:21 AM

## 2018-04-17 NOTE — Progress Notes (Signed)
Patient is eating, ambulating, voiding.  Pain control is good.  Appropriate lochia, no complaints.  Vitals:   04/17/18 0231 04/17/18 0250 04/17/18 0407 04/17/18 0820  BP: 106/62 124/66 111/61 110/69  Pulse: 95 80 76 83  Resp: 16 16 16 16   Temp: 98 F (36.7 C) 97.7 F (36.5 C) 98.5 F (36.9 C) 97.8 F (36.6 C)  TempSrc: Oral Oral Oral Oral  SpO2:  100%  99%  Weight:      Height:        Fundus firm Ext: no calf tenderness  Lab Results  Component Value Date   WBC 10.9 (H) 04/17/2018   HGB 10.7 (L) 04/17/2018   HCT 31.9 (L) 04/17/2018   MCV 96.1 04/17/2018   PLT 178 04/17/2018    --/--/A NEG (07/16 1030)  A/P Post partum day 0 Circ desired, baby has not peed and not been cleared by MD, less than 12 hours old  Routine care .    Philip AspenALLAHAN, Louie Flenner

## 2018-04-17 NOTE — Plan of Care (Signed)
  Problem: Role Relationship: Goal: Ability to demonstrate positive interaction with newborn will improve Outcome: Completed/Met Note:  Patient states that she is unsure if baby is breastfeeding well or not. Encouraged patient to call when breastfeeding in order for nurse or staff to assist and observe.    Problem: Activity: Goal: Risk for activity intolerance will decrease Note:  Patient ambulating independently. Encouraged patient to call for assistance if she felt unsteady or dizzy. Encouraged patient to empty bladder frequently.

## 2018-04-17 NOTE — Lactation Note (Addendum)
This note was copied from a baby's chart. Lactation Consultation Note  Patient Name: Sara Melanie CrazierChelsea Fletcher MWNUU'VToday's Date: 04/17/2018 Reason for consult: Initial assessment;Term  6220 hours old FT female who is being exclusively BF by his mother, she's a P2 but not very experienced BF. She was able to BF her first child 8 years ago for about 2 weeks due to baby's medical condition, she had to have surgery for pyloric stenosis and mom just quit BF and decided to formula fed. She remembers how to hand express and [facility] she was able to get a few droplets of colostrum. One of her visitors is planning on getting her a pump, offered a hand pump since mom doesn't have one at home yet, but she voiced she'd rather wait to get her DEBP as a gift.   Mom was trying to nurse baby when entering the room, offered assistance with latch, mom voiced she was already getting sore and that BF is getting painful but when LC checked nipples were still intact, she's probably experiencing transient soreness, since the feeding of discomfort improves through the feeding. Reviewed treatment for sore nipples. Noticed baby had a slightly shallow latch, helped mom with correcting positioning and discussed tips for a deeper latch. Mom was very pleased to find out the latch wasn't as painful, baby had a big wide open mouth with flanged lips, audible swallows were also noted. Baby still nursing when exiting the room.   Encouraged mom to feed baby STS 8-12 times/24 hours or sooner if feeding cues are present; discussed cluster feeding. Reviewed BF brochure, BF resources and feeding diary, both parents are aware of LC services and will call PRN.  Maternal Data Formula Feeding for Exclusion: No Has patient been taught Hand Expression?: Yes Does the patient have breastfeeding experience prior to this delivery?: Yes  Feeding Feeding Type: Breast Fed Length of feed: 10 min(baby still nursing when exiting the  room)  LATCH Score Latch: Repeated attempts needed to sustain latch, nipple held in mouth throughout feeding, stimulation needed to elicit sucking reflex.  Audible Swallowing: A few with stimulation  Type of Nipple: Everted at rest and after stimulation  Comfort (Breast/Nipple): Soft / non-tender(mom only experiencing mild discomfort during beginning of the feeding, probably transient soreness)  Hold (Positioning): No assistance needed to correctly position infant at breast.(minimal assistance needed)  LATCH Score: 8  Interventions Interventions: Breast feeding basics reviewed;Assisted with latch;Skin to skin;Breast massage;Hand express;Breast compression;Adjust position;Support pillows  Lactation Tools Discussed/Used WIC Program: No   Consult Status Consult Status: Follow-up Date: 04/18/18 Follow-up type: In-patient    Sebastion Jun Venetia ConstableS Tanishia Lemaster 04/17/2018, 8:55 PM

## 2018-04-17 NOTE — Anesthesia Postprocedure Evaluation (Signed)
Anesthesia Post Note  Patient: Sara CrazierChelsea Fletcher  Procedure(s) Performed: AN AD HOC LABOR EPIDURAL     Patient location during evaluation: Mother Baby Anesthesia Type: Epidural Level of consciousness: awake and alert Pain management: pain level controlled Vital Signs Assessment: post-procedure vital signs reviewed and stable Respiratory status: spontaneous breathing, nonlabored ventilation and respiratory function stable Cardiovascular status: stable Postop Assessment: no headache, no backache and epidural receding Anesthetic complications: no    Last Vitals:  Vitals:   04/17/18 0131 04/17/18 0145  BP: 104/81 125/71  Pulse: (!) 148 (!) 102  Resp: 18 18  Temp:    SpO2:      Last Pain:  Vitals:   04/17/18 0145  TempSrc:   PainSc: 0-No pain   Pain Goal: Patients Stated Pain Goal: 5 (04/16/18 1032)               Antonieta Slaven

## 2018-04-18 LAB — RH IG WORKUP (INCLUDES ABO/RH)
ABO/RH(D): A NEG
Fetal Screen: NEGATIVE
GESTATIONAL AGE(WKS): 39.6
Unit division: 0

## 2018-04-18 MED ORDER — IBUPROFEN 800 MG PO TABS: 800.0000 mg | ORAL_TABLET | Freq: Three times a day (TID) | ORAL | 0 refills | Status: DC

## 2018-04-18 NOTE — Progress Notes (Signed)
MOB was referred for history of depression/anxiety. * Referral screened out by Clinical Social Worker because none of the following criteria appear to apply: ~ History of anxiety/depression during this pregnancy, or of post-partum depression. ~ Diagnosis of anxiety and/or depression within last 3 years OR * MOB's symptoms currently being treated with medication and/or therapy. MOB has a Rx for Buspar.  Please contact the Clinical Social Worker if needs arise, by MOB request, or if MOB scores greater than 9/yes to question 10 on Edinburgh Postpartum Depression Screen.  Inara Dike Boyd-Gilyard, MSW, LCSW Clinical Social Work [phone] 

## 2018-04-18 NOTE — Discharge Summary (Signed)
Obstetric Discharge Summary Reason for Admission: induction of labor Prenatal Procedures: none Intrapartum Procedures: spontaneous vaginal delivery Postpartum Procedures: none Complications-Operative and Postpartum: 2 degree perineal laceration Hemoglobin  Date Value Ref Range Status  04/17/2018 10.7 (L) 12.0 - 15.0 g/dL Final   HCT  Date Value Ref Range Status  04/17/2018 31.9 (L) 36.0 - 46.0 % Final    Physical Exam:  General: alert, cooperative and appears stated age 27Lochia: appropriate Uterine Fundus: firm DVT Evaluation: No evidence of DVT seen on physical exam.  Discharge Diagnoses: Term Pregnancy-delivered  Discharge Information: Date: 04/18/2018 Activity: pelvic rest Diet: routine Medications: PNV and Ibuprofen Condition: stable Instructions: refer to practice specific booklet Discharge to: home Follow-up Information    Carrington ClampHorvath, Michelle, MD Follow up in 2 week(s).   Specialty:  Obstetrics and Gynecology Why:  10-14 f/u for postpartum mood check Contact information: [address] [place] [address] [phone]           Newborn Data: Live born female  Birth Weight: 8 lb 8.9 oz (3881 g) APGAR: 9, 9  Newborn Delivery   Birth date/time:  04/17/2018 00:35:00 Delivery type:  Vaginal, Spontaneous     Home with mother.  Sara Fletcher Sara Fletcher Sara Fletcher 04/18/2018, 9:42 AM

## 2018-04-18 NOTE — Progress Notes (Signed)
Patient discussing her history of anxiety with RN and states that she has not taken anything during pregnancy. Patient states that she can tell she has been more anxious since pregnancy and after delivery; however, doctor has already made rounds. Discussed with patient that medication can be prescribed for her to go home with to help. Patient states she will just talk to doctor during her postpartum visit. This RN discussed the importance of not waiting until her anxiety becomes debilitating. Notified Dr. Chestine Sporelark, who encourages the patient to have follow up in the office in 10-14 days instead of waiting the 4-6 weeks. Discussed plan with patient and encouraged patient to call the office to make appointment. Also encouraged the patient to call the office prior to appointment if she feels her anxiety is increasing and she needs medication or assistance sooner. Patient verbalizes understanding and states she will get help if needed. Earl Galasborne, Linda HedgesStefanie Simonton Lake[place]udspeth

## 2018-04-18 NOTE — Lactation Note (Signed)
This note was copied from a baby's chart. Lactation Consultation Note  Patient Name: Sara Fletcher ZOXWR'UToday's Date: 04/18/2018 Reason for consult: Follow-up assessment Mom a little overwhelmed because baby cluster fed all night.  Nipples sore.  Expressing colostrum and then using coconut oil.  Reviewed using breast massage and compression during feeding.  Discussed milk coming to volume.  Lactation outpatient services and support reviewed and encouraged prn.  Maternal Data    Feeding    LATCH Score                   Interventions    Lactation Tools Discussed/Used     Consult Status Consult Status: Complete Follow-up type: Call as needed    Huston FoleyMOULDEN, [name] 04/18/2018, 1:46 PM

## 2018-04-18 NOTE — Progress Notes (Addendum)
Patient is doing well.  She is ambulating, voiding, tolerating PO.  Pain control is good.  Lochia is appropriate  Vitals:   04/17/18 1215 04/17/18 1600 04/17/18 2326 04/18/18 0511  BP: 115/67 115/65 120/77 128/69  Pulse: 79 85 86 85  Resp: 18 16 16 16   Temp: 98.3 F (36.8 C) 98.4 F (36.9 C) 98.2 F (36.8 C) 98.7 F (37.1 C)  TempSrc: Oral Oral Oral Oral  SpO2:      Weight:      Height:        NAD Fundus firm Ext: trace edema  Lab Results  Component Value Date   WBC 10.9 (H) 04/17/2018   HGB 10.7 (L) 04/17/2018   HCT 31.9 (L) 04/17/2018   MCV 96.1 04/17/2018   PLT 178 04/17/2018    --/--/A NEG (07/17 0537)/RI  A/P 26 y.o. [id number] PPD#1 s/p TSVD. Routine care.   Rh neg, s/p rhogam History of anxiety--desires short interval f/u in office postpartum  Desires circumcision. Discussed r/b/a of the procedure. Reviewed that circumcision is an elective surgical procedure and not considered medically necessary. Reviewed the risks of the procedure including the risk of infection, bleeding, damage to surrounding structures, including scrotum, shaft, urethra and head of penis, and an undesired cosmetic effect requiring additional procedures for revision. Consent signed.        Sara Fletcher

## 2018-04-20 LAB — TYPE AND SCREEN
ABO/RH(D): A NEG
ANTIBODY SCREEN: POSITIVE
Unit division: 0
Unit division: 0

## 2018-04-20 LAB — BPAM RBC
BLOOD PRODUCT EXPIRATION DATE: 201908112359
Blood Product Expiration Date: 201908112359
UNIT TYPE AND RH: 600
Unit Type and Rh: 600

## 2018-04-22 NOTE — H&P (Signed)
27 y.o. Unknown  G1P1001 comes in for elective induction.  Otherwise has good fetal movement and no bleeding.      Past Medical History:  Diagnosis Date  . Anxiety   . Asthma    no problem in past 4 years  . Mental disorder   . Pneumonia    [facility]  . Vitamin deficiency          Past Surgical History:  Procedure Laterality Date  . CHOLECYSTECTOMY N/A 08/20/2013   Procedure: LAPAROSCOPIC CHOLECYSTECTOMY WITH POSSIBLE CHOLANGIOGRAM;  Surgeon: Shelly Rubensteinouglas A Blackman, MD;  Location: [facility] OR;  Service: General;  Laterality: N/A;  . WISDOM TOOTH EXTRACTION                      OB History  Gravida Para Term Preterm AB Living  1 1 1     1   SAB TAB Ectopic Multiple Live Births             1       # Outcome Date GA Lbr Len/2nd Weight Sex Delivery Anes PTL Lv  1 Term      Vag-Spont   LIV    Social History        Socioeconomic History  . Marital status: Single    Spouse name: Not on file  . Number of children: Not on file  . Years of education: Not on file  . Highest education level: Not on file  Occupational History  . Not on file  Social Needs  . Financial resource strain: Not on file  . Food insecurity:    Worry: Not on file    Inability: Not on file  . Transportation needs:    Medical: Not on file    Non-medical: Not on file  Tobacco Use  . Smoking status: Current Every Day Smoker    Years: 3.00    Last attempt to quit: 07/31/2013    Years since quitting: 4.7  . Smokeless tobacco: Never Used  Substance and Sexual Activity  . Alcohol use: No    Comment: occasional  . Drug use: No  . Sexual activity: Yes    Birth control/protection: Injection  Lifestyle  . Physical activity:    Days per week: Not on file    Minutes per session: Not on file  . Stress: Not on file  Relationships  . Social connections:    Talks on phone: Not on file    Gets together: Not on file    Attends religious  service: Not on file    Active member of club or organization: Not on file    Attends meetings of clubs or organizations: Not on file    Relationship status: Not on file  . Intimate partner violence:    Fear of current or ex partner: Not on file    Emotionally abused: Not on file    Physically abused: Not on file    Forced sexual activity: Not on file  Other Topics Concern  . Not on file  Social History Narrative  . Not on file   Patient has no known allergies.    Prenatal Transfer Tool  Maternal Diabetes: No Genetic Screening: NT <2, no FTS reported Maternal Ultrasounds/Referrals: Normal Fetal Ultrasounds or other Referrals:  None Maternal Substance Abuse:  No Significant Maternal Medications:  Meds include: Other: Valcyclovir for genital HSV Significant Maternal Lab Results: None  Other PNC: uncomplicated.  Last US EFW >90%, 8#13, 3991g.  Pt has hx of >8# first infant.     There were no vitals filed for this visit.  Lungs/Cor:  NAD Abdomen:  soft, gravid Ex:  no cords, erythema SVE:  1/50/-2 [id number] for lesions FHTs:  150s, good STV, NST R Toco:  q irreg   A/P   Term elecitve induction for LGA.           GBS neg.  Solana Coggin A

## 2018-05-07 DIAGNOSIS — Z369 Encounter for antenatal screening, unspecified: Secondary | ICD-10-CM | POA: Diagnosis not present

## 2018-06-17 DIAGNOSIS — Z3040 Encounter for surveillance of contraceptives, unspecified: Secondary | ICD-10-CM | POA: Diagnosis not present

## 2018-06-17 DIAGNOSIS — Z6824 Body mass index (BMI) 24.0-24.9, adult: Secondary | ICD-10-CM | POA: Diagnosis not present

## 2018-06-17 DIAGNOSIS — Z30017 Encounter for initial prescription of implantable subdermal contraceptive: Secondary | ICD-10-CM | POA: Diagnosis not present

## 2018-06-28 DIAGNOSIS — J069 Acute upper respiratory infection, unspecified: Secondary | ICD-10-CM | POA: Diagnosis not present

## 2018-06-28 DIAGNOSIS — Z6824 Body mass index (BMI) 24.0-24.9, adult: Secondary | ICD-10-CM | POA: Diagnosis not present

## 2018-07-02 DIAGNOSIS — Z6824 Body mass index (BMI) 24.0-24.9, adult: Secondary | ICD-10-CM | POA: Diagnosis not present

## 2018-07-02 DIAGNOSIS — Z3040 Encounter for surveillance of contraceptives, unspecified: Secondary | ICD-10-CM | POA: Diagnosis not present

## 2018-07-17 DIAGNOSIS — R109 Unspecified abdominal pain: Secondary | ICD-10-CM | POA: Diagnosis not present

## 2018-07-17 DIAGNOSIS — Z6824 Body mass index (BMI) 24.0-24.9, adult: Secondary | ICD-10-CM | POA: Diagnosis not present

## 2018-07-17 DIAGNOSIS — Z3490 Encounter for supervision of normal pregnancy, unspecified, unspecified trimester: Secondary | ICD-10-CM | POA: Diagnosis not present

## 2018-07-17 DIAGNOSIS — N912 Amenorrhea, unspecified: Secondary | ICD-10-CM | POA: Diagnosis not present

## 2018-07-26 DIAGNOSIS — Z3201 Encounter for pregnancy test, result positive: Secondary | ICD-10-CM | POA: Diagnosis not present

## 2018-07-26 DIAGNOSIS — Z6825 Body mass index (BMI) 25.0-25.9, adult: Secondary | ICD-10-CM | POA: Diagnosis not present

## 2018-07-26 DIAGNOSIS — N925 Other specified irregular menstruation: Secondary | ICD-10-CM | POA: Diagnosis not present

## 2018-08-02 DIAGNOSIS — Z3480 Encounter for supervision of other normal pregnancy, unspecified trimester: Secondary | ICD-10-CM | POA: Diagnosis not present

## 2018-08-02 DIAGNOSIS — O3680X9 Pregnancy with inconclusive fetal viability, other fetus: Secondary | ICD-10-CM | POA: Diagnosis not present

## 2018-08-27 DIAGNOSIS — Z124 Encounter for screening for malignant neoplasm of cervix: Secondary | ICD-10-CM | POA: Diagnosis not present

## 2018-08-27 DIAGNOSIS — O3680X9 Pregnancy with inconclusive fetal viability, other fetus: Secondary | ICD-10-CM | POA: Diagnosis not present

## 2018-08-27 DIAGNOSIS — Z23 Encounter for immunization: Secondary | ICD-10-CM | POA: Diagnosis not present

## 2018-08-27 DIAGNOSIS — O26849 Uterine size-date discrepancy, unspecified trimester: Secondary | ICD-10-CM | POA: Diagnosis not present

## 2018-08-27 DIAGNOSIS — Z113 Encounter for screening for infections with a predominantly sexual mode of transmission: Secondary | ICD-10-CM | POA: Diagnosis not present

## 2018-09-13 DIAGNOSIS — Z369 Encounter for antenatal screening, unspecified: Secondary | ICD-10-CM | POA: Diagnosis not present

## 2018-09-17 DIAGNOSIS — Z3480 Encounter for supervision of other normal pregnancy, unspecified trimester: Secondary | ICD-10-CM | POA: Diagnosis not present

## 2018-09-17 DIAGNOSIS — Z3682 Encounter for antenatal screening for nuchal translucency: Secondary | ICD-10-CM | POA: Diagnosis not present

## 2018-09-17 LAB — OB RESULTS CONSOLE ABO/RH: RH Type: NEGATIVE

## 2018-09-17 LAB — OB RESULTS CONSOLE RUBELLA ANTIBODY, IGM: Rubella: IMMUNE

## 2018-09-17 LAB — OB RESULTS CONSOLE HIV ANTIBODY (ROUTINE TESTING): HIV: NONREACTIVE

## 2018-09-17 LAB — OB RESULTS CONSOLE RPR: RPR: REACTIVE

## 2018-09-17 LAB — OB RESULTS CONSOLE GC/CHLAMYDIA
Chlamydia: NEGATIVE
Gonorrhea: NEGATIVE

## 2018-09-17 LAB — OB RESULTS CONSOLE HEPATITIS B SURFACE ANTIGEN: Hepatitis B Surface Ag: NEGATIVE

## 2018-09-17 LAB — OB RESULTS CONSOLE ANTIBODY SCREEN: Antibody Screen: NEGATIVE

## 2018-10-01 DIAGNOSIS — Z3482 Encounter for supervision of other normal pregnancy, second trimester: Secondary | ICD-10-CM | POA: Diagnosis not present

## 2018-10-01 DIAGNOSIS — O26819 Pregnancy related exhaustion and fatigue, unspecified trimester: Secondary | ICD-10-CM | POA: Diagnosis not present

## 2018-10-01 DIAGNOSIS — Z369 Encounter for antenatal screening, unspecified: Secondary | ICD-10-CM | POA: Diagnosis not present

## 2018-10-02 NOTE — L&D Delivery Note (Signed)
Patient was C/C/+3 and pushed for 8 minutes with epidural.    NSVD  Female OP infant, Apgars 9,9, weight P.   The patient had a midline first degree perineal laceration repaired with 2-0 vicryl R. Fundus was firm. EBL was expected amount. Placenta was delivered intact. Vagina was clear.  Delayed cord clamping done for 30-60 seconds while warming baby. Baby was vigorous and doing skin to skin with mother.  Sara Fletcher

## 2018-10-30 DIAGNOSIS — Z363 Encounter for antenatal screening for malformations: Secondary | ICD-10-CM | POA: Diagnosis not present

## 2018-12-02 DIAGNOSIS — Z369 Encounter for antenatal screening, unspecified: Secondary | ICD-10-CM | POA: Diagnosis not present

## 2018-12-17 DIAGNOSIS — Z369 Encounter for antenatal screening, unspecified: Secondary | ICD-10-CM | POA: Diagnosis not present

## 2018-12-17 DIAGNOSIS — Z3480 Encounter for supervision of other normal pregnancy, unspecified trimester: Secondary | ICD-10-CM | POA: Diagnosis not present

## 2018-12-31 DIAGNOSIS — O360990 Maternal care for other rhesus isoimmunization, unspecified trimester, not applicable or unspecified: Secondary | ICD-10-CM | POA: Diagnosis not present

## 2019-01-15 DIAGNOSIS — Z23 Encounter for immunization: Secondary | ICD-10-CM | POA: Diagnosis not present

## 2019-02-14 DIAGNOSIS — N898 Other specified noninflammatory disorders of vagina: Secondary | ICD-10-CM | POA: Diagnosis not present

## 2019-02-28 DIAGNOSIS — Z369 Encounter for antenatal screening, unspecified: Secondary | ICD-10-CM | POA: Diagnosis not present

## 2019-02-28 DIAGNOSIS — Z3480 Encounter for supervision of other normal pregnancy, unspecified trimester: Secondary | ICD-10-CM | POA: Diagnosis not present

## 2019-02-28 LAB — OB RESULTS CONSOLE GBS: GBS: NEGATIVE

## 2019-03-06 DIAGNOSIS — Z369 Encounter for antenatal screening, unspecified: Secondary | ICD-10-CM | POA: Diagnosis not present

## 2019-03-11 DIAGNOSIS — Z369 Encounter for antenatal screening, unspecified: Secondary | ICD-10-CM | POA: Diagnosis not present

## 2019-03-12 ENCOUNTER — Other Ambulatory Visit: Payer: Self-pay | Admitting: Obstetrics and Gynecology

## 2019-03-12 ENCOUNTER — Telehealth (HOSPITAL_COMMUNITY): Payer: Self-pay | Admitting: *Deleted

## 2019-03-12 ENCOUNTER — Encounter (HOSPITAL_COMMUNITY): Payer: Self-pay | Admitting: *Deleted

## 2019-03-12 NOTE — Telephone Encounter (Signed)
Preadmission screen  

## 2019-03-18 DIAGNOSIS — Z369 Encounter for antenatal screening, unspecified: Secondary | ICD-10-CM | POA: Diagnosis not present

## 2019-03-19 ENCOUNTER — Other Ambulatory Visit: Payer: Self-pay

## 2019-03-19 ENCOUNTER — Other Ambulatory Visit (HOSPITAL_COMMUNITY)
Admission: RE | Admit: 2019-03-19 | Discharge: 2019-03-19 | Disposition: A | Discharge status: Home / Self Care | Payer: BC Managed Care – PPO | Source: Ambulatory Visit | Attending: Obstetrics and Gynecology | Admitting: Obstetrics and Gynecology

## 2019-03-19 DIAGNOSIS — Z1159 Encounter for screening for other viral diseases: Secondary | ICD-10-CM | POA: Diagnosis not present

## 2019-03-19 NOTE — MAU Note (Signed)
Pt here for covid swab, denies any symptoms 

## 2019-03-20 LAB — NOVEL CORONAVIRUS, NAA (HOSP ORDER, SEND-OUT TO REF LAB; TAT 18-24 HRS): SARS-CoV-2, NAA: NOT DETECTED

## 2019-03-21 ENCOUNTER — Inpatient Hospital Stay (HOSPITAL_COMMUNITY)
Admission: AD | Admit: 2019-03-21 | Discharge: 2019-03-22 | DRG: 807 | Disposition: A | Discharge status: Home / Self Care | Payer: BC Managed Care – PPO | Attending: Obstetrics and Gynecology | Admitting: Obstetrics and Gynecology

## 2019-03-21 ENCOUNTER — Inpatient Hospital Stay (HOSPITAL_COMMUNITY): Payer: BC Managed Care – PPO

## 2019-03-21 ENCOUNTER — Other Ambulatory Visit: Payer: Self-pay

## 2019-03-21 ENCOUNTER — Encounter (HOSPITAL_COMMUNITY): Payer: Self-pay | Admitting: *Deleted

## 2019-03-21 ENCOUNTER — Inpatient Hospital Stay (HOSPITAL_COMMUNITY): Payer: BC Managed Care – PPO | Admitting: Anesthesiology

## 2019-03-21 DIAGNOSIS — Z3A00 Weeks of gestation of pregnancy not specified: Secondary | ICD-10-CM | POA: Diagnosis not present

## 2019-03-21 DIAGNOSIS — Z23 Encounter for immunization: Secondary | ICD-10-CM | POA: Diagnosis not present

## 2019-03-21 DIAGNOSIS — O99334 Smoking (tobacco) complicating childbirth: Secondary | ICD-10-CM | POA: Diagnosis not present

## 2019-03-21 DIAGNOSIS — O26893 Other specified pregnancy related conditions, third trimester: Secondary | ICD-10-CM | POA: Diagnosis not present

## 2019-03-21 DIAGNOSIS — Z3A39 39 weeks gestation of pregnancy: Secondary | ICD-10-CM | POA: Diagnosis not present

## 2019-03-21 DIAGNOSIS — Z6791 Unspecified blood type, Rh negative: Secondary | ICD-10-CM

## 2019-03-21 LAB — CBC
HCT: 33.2 % — ABNORMAL LOW (ref 36.0–46.0)
Hemoglobin: 10.9 g/dL — ABNORMAL LOW (ref 12.0–15.0)
MCH: 30.1 pg (ref 26.0–34.0)
MCHC: 32.8 g/dL (ref 30.0–36.0)
MCV: 91.7 fL (ref 80.0–100.0)
Platelets: 180 10*3/uL (ref 150–400)
RBC: 3.62 MIL/uL — ABNORMAL LOW (ref 3.87–5.11)
RDW: 15.4 % (ref 11.5–15.5)
WBC: 6.6 10*3/uL (ref 4.0–10.5)
nRBC: 0 % (ref 0.0–0.2)

## 2019-03-21 LAB — RPR: RPR Ser Ql: NONREACTIVE

## 2019-03-21 MED ORDER — EPHEDRINE 5 MG/ML INJ: 10.0000 mg | INTRAVENOUS | Status: DC | PRN

## 2019-03-21 MED ORDER — FENTANYL-BUPIVACAINE-NACL 0.5-0.125-0.9 MG/250ML-% EP SOLN
12.0000 mL/h | EPIDURAL | Status: DC | PRN
  Filled 2019-03-21: qty 250

## 2019-03-21 MED ORDER — OXYCODONE-ACETAMINOPHEN 5-325 MG PO TABS
2.0000 | ORAL_TABLET | ORAL | Status: DC | PRN
  Administered 2019-03-22: 2 via ORAL
  Administered 2019-03-22: 1 via ORAL
  Filled 2019-03-21 (×2): qty 2

## 2019-03-21 MED ORDER — MAGNESIUM HYDROXIDE 400 MG/5ML PO SUSP: 30.0000 mL | ORAL | Status: DC | PRN

## 2019-03-21 MED ORDER — COCONUT OIL OIL: 1.0000 "application " | TOPICAL_OIL | Status: DC | PRN

## 2019-03-21 MED ORDER — SODIUM CHLORIDE 0.9% FLUSH
3.0000 mL | Freq: Two times a day (BID) | INTRAVENOUS | Status: DC
  Administered 2019-03-21: 3 mL via INTRAVENOUS

## 2019-03-21 MED ORDER — SODIUM CHLORIDE 0.9% FLUSH: 3.0000 mL | INTRAVENOUS | Status: DC | PRN

## 2019-03-21 MED ORDER — LIDOCAINE HCL (PF) 1 % IJ SOLN
INTRAMUSCULAR | Status: DC | PRN
  Administered 2019-03-21 (×2): 5 mL via EPIDURAL

## 2019-03-21 MED ORDER — LACTATED RINGERS IV SOLN
INTRAVENOUS | Status: DC
  Administered 2019-03-21 (×2): via INTRAVENOUS

## 2019-03-21 MED ORDER — BENZOCAINE-MENTHOL 20-0.5 % EX AERO
1.0000 "application " | INHALATION_SPRAY | CUTANEOUS | Status: DC | PRN
  Administered 2019-03-21: 1 via TOPICAL
  Filled 2019-03-21 (×2): qty 56

## 2019-03-21 MED ORDER — IBUPROFEN 800 MG PO TABS
800.0000 mg | ORAL_TABLET | Freq: Three times a day (TID) | ORAL | Status: DC
  Administered 2019-03-21 – 2019-03-22 (×4): 800 mg via ORAL
  Filled 2019-03-21 (×4): qty 1

## 2019-03-21 MED ORDER — ACETAMINOPHEN 325 MG PO TABS
650.0000 mg | ORAL_TABLET | ORAL | Status: DC | PRN
  Administered 2019-03-22: 650 mg via ORAL
  Filled 2019-03-21: qty 2

## 2019-03-21 MED ORDER — ONDANSETRON HCL 4 MG PO TABS: 4.0000 mg | ORAL_TABLET | ORAL | Status: DC | PRN

## 2019-03-21 MED ORDER — OXYTOCIN 40 UNITS IN NORMAL SALINE INFUSION - SIMPLE MED
1.0000 m[IU]/min | INTRAVENOUS | Status: DC
  Administered 2019-03-21: 2 m[IU]/min via INTRAVENOUS
  Filled 2019-03-21: qty 1000

## 2019-03-21 MED ORDER — LACTATED RINGERS IV SOLN: 500.0000 mL | INTRAVENOUS | Status: DC | PRN

## 2019-03-21 MED ORDER — LIDOCAINE HCL (PF) 1 % IJ SOLN: 30.0000 mL | INTRAMUSCULAR | Status: DC | PRN

## 2019-03-21 MED ORDER — TETANUS-DIPHTH-ACELL PERTUSSIS 5-2.5-18.5 LF-MCG/0.5 IM SUSP: 0.5000 mL | Freq: Once | INTRAMUSCULAR | Status: DC

## 2019-03-21 MED ORDER — PHENYLEPHRINE 40 MCG/ML (10ML) SYRINGE FOR IV PUSH (FOR BLOOD PRESSURE SUPPORT): 80.0000 ug | PREFILLED_SYRINGE | INTRAVENOUS | Status: DC | PRN

## 2019-03-21 MED ORDER — METHYLERGONOVINE MALEATE 0.2 MG/ML IJ SOLN: 0.2000 mg | INTRAMUSCULAR | Status: DC | PRN

## 2019-03-21 MED ORDER — METHYLERGONOVINE MALEATE 0.2 MG PO TABS: 0.2000 mg | ORAL_TABLET | ORAL | Status: DC | PRN

## 2019-03-21 MED ORDER — ACETAMINOPHEN 325 MG PO TABS: 650.0000 mg | ORAL_TABLET | ORAL | Status: DC | PRN

## 2019-03-21 MED ORDER — MEASLES, MUMPS & RUBELLA VAC IJ SOLR: 0.5000 mL | Freq: Once | INTRAMUSCULAR | Status: DC

## 2019-03-21 MED ORDER — PRENATAL MULTIVITAMIN CH
1.0000 | ORAL_TABLET | Freq: Every day | ORAL | Status: DC
  Administered 2019-03-22: 1 via ORAL
  Filled 2019-03-21: qty 1

## 2019-03-21 MED ORDER — LACTATED RINGERS IV SOLN
500.0000 mL | Freq: Once | INTRAVENOUS | Status: AC
  Administered 2019-03-21: 500 mL via INTRAVENOUS

## 2019-03-21 MED ORDER — SODIUM CHLORIDE 0.9 % IV SOLN: 250.0000 mL | INTRAVENOUS | Status: DC | PRN

## 2019-03-21 MED ORDER — FERROUS SULFATE 325 (65 FE) MG PO TABS
325.0000 mg | ORAL_TABLET | Freq: Two times a day (BID) | ORAL | Status: DC
  Administered 2019-03-21 – 2019-03-22 (×3): 325 mg via ORAL
  Filled 2019-03-21 (×4): qty 1

## 2019-03-21 MED ORDER — OXYTOCIN BOLUS FROM INFUSION
500.0000 mL | Freq: Once | INTRAVENOUS | Status: AC
  Administered 2019-03-21: 500 mL via INTRAVENOUS

## 2019-03-21 MED ORDER — DIPHENHYDRAMINE HCL 50 MG/ML IJ SOLN: 12.5000 mg | INTRAMUSCULAR | Status: DC | PRN

## 2019-03-21 MED ORDER — TERBUTALINE SULFATE 1 MG/ML IJ SOLN: 0.2500 mg | Freq: Once | INTRAMUSCULAR | Status: DC | PRN

## 2019-03-21 MED ORDER — DIPHENHYDRAMINE HCL 25 MG PO CAPS: 25.0000 mg | ORAL_CAPSULE | Freq: Four times a day (QID) | ORAL | Status: DC | PRN

## 2019-03-21 MED ORDER — ZOLPIDEM TARTRATE 5 MG PO TABS: 5.0000 mg | ORAL_TABLET | Freq: Every evening | ORAL | Status: DC | PRN

## 2019-03-21 MED ORDER — SENNOSIDES-DOCUSATE SODIUM 8.6-50 MG PO TABS
2.0000 | ORAL_TABLET | ORAL | Status: DC
  Administered 2019-03-22: 2 via ORAL
  Filled 2019-03-21: qty 2

## 2019-03-21 MED ORDER — DIBUCAINE (PERIANAL) 1 % EX OINT: 1.0000 "application " | TOPICAL_OINTMENT | CUTANEOUS | Status: DC | PRN

## 2019-03-21 MED ORDER — SIMETHICONE 80 MG PO CHEW: 80.0000 mg | CHEWABLE_TABLET | ORAL | Status: DC | PRN

## 2019-03-21 MED ORDER — ONDANSETRON HCL 4 MG/2ML IJ SOLN: 4.0000 mg | INTRAMUSCULAR | Status: DC | PRN

## 2019-03-21 MED ORDER — ONDANSETRON HCL 4 MG/2ML IJ SOLN: 4.0000 mg | Freq: Four times a day (QID) | INTRAMUSCULAR | Status: DC | PRN

## 2019-03-21 MED ORDER — WITCH HAZEL-GLYCERIN EX PADS: 1.0000 "application " | MEDICATED_PAD | CUTANEOUS | Status: DC | PRN

## 2019-03-21 MED ORDER — SOD CITRATE-CITRIC ACID 500-334 MG/5ML PO SOLN: 30.0000 mL | ORAL | Status: DC | PRN

## 2019-03-21 MED ORDER — SODIUM CHLORIDE (PF) 0.9 % IJ SOLN
INTRAMUSCULAR | Status: DC | PRN
  Administered 2019-03-21: 14 mL/h via EPIDURAL

## 2019-03-21 MED ORDER — OXYTOCIN 40 UNITS IN NORMAL SALINE INFUSION - SIMPLE MED: 2.5000 [IU]/h | INTRAVENOUS | Status: DC

## 2019-03-21 NOTE — Anesthesia Procedure Notes (Signed)
Epidural Patient location during procedure: OB  Staffing Anesthesiologist: Avyonna Wagoner, MD Performed: anesthesiologist   Preanesthetic Checklist Completed: patient identified, site marked, surgical consent, pre-op evaluation, timeout performed, IV checked, risks and benefits discussed and monitors and equipment checked  Epidural Patient position: sitting Prep: DuraPrep Patient monitoring: heart rate, continuous pulse ox and blood pressure Approach: midline Location: L3-L4 Injection technique: LOR saline  Needle:  Needle type: Tuohy  Needle gauge: 17 G Needle length: 9 cm and 9 Needle insertion depth: 6 cm Catheter type: closed end flexible Catheter size: 20 Guage Catheter at skin depth: 10 cm Test dose: negative  Assessment Events: blood not aspirated, injection not painful, no injection resistance, negative IV test and no paresthesia  Additional Notes Patient identified. Risks/Benefits/Options discussed with patient including but not limited to bleeding, infection, nerve damage, paralysis, failed block, incomplete pain control, headache, blood pressure changes, nausea, vomiting, reactions to medication both or allergic, itching and postpartum back pain. Confirmed with bedside nurse the patient's most recent platelet count. Confirmed with patient that they are not currently taking any anticoagulation, have any bleeding history or any family history of bleeding disorders. Patient expressed understanding and wished to proceed. All questions were answered. Sterile technique was used throughout the entire procedure. Please see nursing notes for vital signs. Test dose was given through epidural needle and negative prior to continuing to dose epidural or start infusion. Warning signs of high block given to the patient including shortness of breath, tingling/numbness in hands, complete motor block, or any concerning symptoms with instructions to call for help. Patient was given  instructions on fall risk and not to get out of bed. All questions and concerns addressed with instructions to call with any issues.     

## 2019-03-21 NOTE — Lactation Note (Signed)
This note was copied from a baby's chart. Lactation Consultation Note  Patient Name: Sara Fletcher [id number] Date: 03/21/2019 Reason for consult: Initial assessment;Term  6 hours old FT female who is being exclusively BF by her mother, she's a P3 but not very experienced BF. Mom had a really hard time with her last baby, she only BF for three weeks due to sore nipples. Her last baby was diagnosed with a lip and a tongue tied on the 2 week check up with the pediatrician and mom's nipples had blisters by then, she also got engorged which made things worse, mom said that not even pumping would resolved the engorgement and her depression started to kick in again so she just quit BF.   Mom didn't have to get baby's tongue/lip fixed, because it resolved on its own. She participated in the [facility] program at [facility] and she's already familiar with hand expression, her RN Stanton Kidney has been very proactive and have helped mom spoon fed baby. When [facility] assisted with hand expression, mom was able to easily get big drops of colostrum, praised her for her efforts. She has a [employer] DEBP at home.  Offered assistance with latch but mom politely declined, baby was asleep. Asked mom to call for assistance when needed. She's worried about baby not latching on consistently, provided reassurance that this time things will get better and that we'll bring an specialist if we have to, to rule out any potential problems that may interfere with the feedings. LC also offered to set up a DEBP but mom will think about it, she'll let her RN know when/if she's ready to start pumping, she came as breast/formula as her feeding choice on admission. Reviewed cluster feeding, feeding cues, feeding difficulties and normal newborn behavior.  Feeding plan:  1. Encouraged mom to feed baby STS 8-12 times/24 hours or sooner if feeding cues are present 2. Hand expression and spoon feeding were also encouraged  BF brochure, BF resources and  feeding diary were reviewed. Parents reported all questions and concerns were answered, they're both aware of [facility] OP services and will call PRN.  Maternal Data Formula Feeding for Exclusion: Yes Reason for exclusion: Mother's choice to formula and breast feed on admission Has patient been taught Hand Expression?: Yes Does the patient have breastfeeding experience prior to this delivery?: Yes  Feeding Feeding Type: Breast Fed  LATCH Score Latch: Too sleepy or reluctant, no latch achieved, no sucking elicited.  Interventions Interventions: Breast feeding basics reviewed;Breast massage;Breast compression;Hand express  Lactation Tools Discussed/Used WIC Program: Yes   Consult Status Consult Status: Follow-up Date: 03/22/19 Follow-up type: In-patient    Demetry Bendickson Francene Boyers 03/21/2019, 9:12 PM

## 2019-03-21 NOTE — Anesthesia Preprocedure Evaluation (Signed)
Anesthesia Evaluation  Patient identified by MRN, date of birth, ID band Patient awake    Reviewed: Allergy & Precautions, H&P , NPO status , Patient's Chart, lab work & pertinent test results  History of Anesthesia Complications Negative for: history of anesthetic complications  Airway Mallampati: II  TM Distance: >3 FB Neck ROM: full    Dental no notable dental hx. (+) Teeth Intact   Pulmonary neg pulmonary ROS, Current Smoker,    Pulmonary exam normal breath sounds clear to auscultation       Cardiovascular negative cardio ROS Normal cardiovascular exam Rhythm:regular Rate:Normal     Neuro/Psych negative neurological ROS  negative psych ROS   GI/Hepatic negative GI ROS, Neg liver ROS,   Endo/Other  negative endocrine ROS  Renal/GU negative Renal ROS  negative genitourinary   Musculoskeletal   Abdominal   Peds  Hematology negative hematology ROS (+)   Anesthesia Other Findings   Reproductive/Obstetrics (+) Pregnancy                             Anesthesia Physical Anesthesia Plan  ASA: II  Anesthesia Plan: Epidural   Post-op Pain Management:    Induction:   PONV Risk Score and Plan:   Airway Management Planned:   Additional Equipment:   Intra-op Plan:   Post-operative Plan:   Informed Consent: I have reviewed the patients History and Physical, chart, labs and discussed the procedure including the risks, benefits and alternatives for the proposed anesthesia with the patient or authorized representative who has indicated his/her understanding and acceptance.     Plan Discussed with:   Anesthesia Plan Comments:         Anesthesia Quick Evaluation  

## 2019-03-21 NOTE — H&P (Signed)
28 y.o. 2751w3d  G3P2002 comes in for induction at term.  Otherwise has good fetal movement and no bleeding.  Past Medical History:  Diagnosis Date  . Anxiety   . Asthma    no problem in past 4 years  . Mental disorder   . Pneumonia    [facility] 2013  . Vitamin deficiency     Past Surgical History:  Procedure Laterality Date  . CHOLECYSTECTOMY N/A 08/20/2013   Procedure: LAPAROSCOPIC CHOLECYSTECTOMY WITH POSSIBLE CHOLANGIOGRAM;  Surgeon: Shelly Rubensteinouglas A Blackman, MD;  Location: [facility] OR;  Service: General;  Laterality: N/A;  . WISDOM TOOTH EXTRACTION      OB History  Gravida Para Term Preterm AB Living  3 2 2     2   SAB TAB Ectopic Multiple Live Births        0 2    # Outcome Date GA Lbr Len/2nd Weight Sex Delivery Anes PTL Lv  3 Current           2 Term 04/17/18 9486w6d 12:50 / 00:15 3881 g M Vag-Spont EPI  LIV     Birth Comments: wnl  1 Term      Vag-Spont   LIV    Social History   Socioeconomic History  . Marital status: Single    Spouse name: Not on file  . Number of children: Not on file  . Years of education: Not on file  . Highest education level: Not on file  Occupational History  . Not on file  Social Needs  . Financial resource strain: Not hard at all  . Food insecurity    Worry: Never true    Inability: Never true  . Transportation needs    Medical: No    Non-medical: Not on file  Tobacco Use  . Smoking status: Current Every Day Smoker    Years: 3.00    Last attempt to quit: 07/31/2013    Years since quitting: 5.6  . Smokeless tobacco: Never Used  Substance and Sexual Activity  . Alcohol use: No    Comment: occasional  . Drug use: No  . Sexual activity: Yes    Birth control/protection: Injection  Lifestyle  . Physical activity    Days per week: Not on file    Minutes per session: Not on file  . Stress: To some extent  Relationships  . Social Musicianconnections    Talks on phone: Not on file    Gets together: Not on file    Attends  religious service: Not on file    Active member of club or organization: Not on file    Attends meetings of clubs or organizations: Not on file    Relationship status: Not on file  . Intimate partner violence    Fear of current or ex partner: No    Emotionally abused: No    Physically abused: No    Forced sexual activity: No  Other Topics Concern  . Not on file  Social History Narrative  . Not on file   Patient has no known allergies.    Prenatal Transfer Tool  Maternal Diabetes: No Genetic Screening: Normal Maternal Ultrasounds/Referrals: Normal Fetal Ultrasounds or other Referrals:  None Maternal Substance Abuse:  No Significant Maternal Medications:  Meds include: Other: Valtrex for HSV Significant Maternal Lab Results: Group B Strep negative, Rh negative and Other: hx HSV  Other PNC: uncomplicated.    There were no vitals filed for this visit.  Lungs/Cor:  NAD Abdomen:  soft, gravid Ex:  no cords, erythema SVE:  4/50/-2 SSE: neg for lesions FHTs:  130s, good STV, NST R; Cat 1 tracing. Toco:  q occ   A/P   Term induction.    GBS neg.    Sara Fletcher

## 2019-03-22 LAB — CBC
HCT: 30.6 % — ABNORMAL LOW (ref 36.0–46.0)
Hemoglobin: 9.8 g/dL — ABNORMAL LOW (ref 12.0–15.0)
MCH: 30.2 pg (ref 26.0–34.0)
MCHC: 32 g/dL (ref 30.0–36.0)
MCV: 94.2 fL (ref 80.0–100.0)
Platelets: 165 10*3/uL (ref 150–400)
RBC: 3.25 MIL/uL — ABNORMAL LOW (ref 3.87–5.11)
RDW: 15.6 % — ABNORMAL HIGH (ref 11.5–15.5)
WBC: 6.9 10*3/uL (ref 4.0–10.5)
nRBC: 0 % (ref 0.0–0.2)

## 2019-03-22 MED ORDER — RHO D IMMUNE GLOBULIN 1500 UNIT/2ML IJ SOSY
300.0000 ug | PREFILLED_SYRINGE | Freq: Once | INTRAMUSCULAR | Status: AC
  Administered 2019-03-22: 300 ug via INTRAVENOUS
  Filled 2019-03-22: qty 2

## 2019-03-22 NOTE — Lactation Note (Signed)
This note was copied from a baby's chart. Lactation Consultation Note  Patient Name: Sara Fletcher [id number] Date: 03/22/2019 Reason for consult: Term  P3 mother whose infant is now 67 hours old.  Mother is not an experienced breast feeding mother.  She only breast fed for three weeks due to sore nipples.  Her last baby had a lip and tongue tie and mother stated that it was too painful to feed.  She wants to try to breast feed this baby.  Offered to assist with latching and mother accepted.  Mother prefers the cradle hold but has not had much success with this hold.  I suggested the cross cradle after she verbalized that she does not like the football hold.  Positioned mother appropriately and assisted to latch in the cross cradle hold on the left breast.  Reminded mother about hand/finger positioning and to wait for baby to have a wide gape prior to latching.  Explained how sore nipples can develop with a shallow or improper latch.  Baby was able to latch and suck a few times and became restless.  Kept putting her back to the breast with the same results.  Attempted to burp and tried again.  Baby was able to latch and suck but was not interested in more than a few minutes.  After she became restless again I suggested mother hold her STS and wait until she shows more feeding cues.  Mother agreed.  Mother stated that baby has been spitting and passing gas.  She seemed to be irritated by this while we were attempting to feed and latch.  Encouraged frequent burping.  Mother will call as needed.  Father present.   Maternal Data Formula Feeding for Exclusion: No Has patient been taught Hand Expression?: Yes Does the patient have breastfeeding experience prior to this delivery?: Yes  Feeding Feeding Type: Breast Fed  LATCH Score Latch: Repeated attempts needed to sustain latch, nipple held in mouth throughout feeding, stimulation needed to elicit sucking reflex.  Audible Swallowing: None  Type  of Nipple: Everted at rest and after stimulation  Comfort (Breast/Nipple): Soft / non-tender  Hold (Positioning): Assistance needed to correctly position infant at breast and maintain latch.  LATCH Score: 6  Interventions Interventions: Breast feeding basics reviewed;Skin to skin;Assisted with latch;Breast massage;Hand express;Breast compression;Position options;Support pillows  Lactation Tools Discussed/Used WIC Program: Yes   Consult Status Consult Status: Follow-up Date: 03/23/19 Follow-up type: In-patient    [name] R [name] 03/22/2019, 1:30 PM

## 2019-03-22 NOTE — Progress Notes (Signed)
Patient is eating, ambulating, voiding.  Pain control is good.  Vitals:   03/21/19 1710 03/21/19 2206 03/22/19 0032 03/22/19 0501  BP: 124/80 118/69 119/74 123/71  Pulse: 96 89 89 76  Resp: 18 18 18 18   Temp: 98.7 F (37.1 C) 99 F (37.2 C) 97.7 F (36.5 C) 97.6 F (36.4 C)  TempSrc: Oral Oral Oral Oral  SpO2: 100% 99% 99% 100%  Weight:      Height:        Fundus firm Perineum without swelling.  Lab Results  Component Value Date   WBC 6.9 03/22/2019   HGB 9.8 (L) 03/22/2019   HCT 30.6 (L) 03/22/2019   MCV 94.2 03/22/2019   PLT 165 03/22/2019    --/--/A NEG (06/20 0448)/RI  A/P Post partum day 1.  Baby RH POS- pt to get Rhogam.  Routine care.  Expect d/c routine.    Daria Pastures

## 2019-03-22 NOTE — Progress Notes (Signed)
MOB was referred for history of depression/anxiety.  * Referral screened out by Clinical Social Worker because none of the following criteria appear to apply:  ~ History of anxiety/depression during this pregnancy, or of post-partum depression following prior delivery. ~ Diagnosis of anxiety and/or depression within last 3 years OR * MOB's symptoms currently being treated with medication and/or therapy.  Please contact the Clinical Social Worker if needs arise, by [facility] request, or if MOB scores greater than 9/yes to question 10 on [place] Postpartum Depression Screen.  Sara Fletcher, [place]  [facility] [employer] [phone]

## 2019-03-22 NOTE — Discharge Summary (Signed)
Obstetric Discharge Summary Reason for Admission: induction of labor Prenatal Procedures: NST Intrapartum Procedures: spontaneous vaginal delivery Postpartum Procedures: Rho(D) Ig Complications-Operative and Postpartum: first degree perineal laceration Hemoglobin  Date Value Ref Range Status  03/22/2019 9.8 (L) 12.0 - 15.0 g/dL Final   HCT  Date Value Ref Range Status  03/22/2019 30.6 (L) 36.0 - 46.0 % Final    Discharge Diagnoses: Term Pregnancy-delivered  Discharge Information: Date: 03/22/2019 Activity: pelvic rest Diet: routine Medications: Ibuprofen and Iron OTC Condition: stable Instructions: refer to practice specific booklet Discharge to: home Follow-up Information    Bobbye Charleston, MD Follow up in 4 week(s).   Specialty: Obstetrics and Gynecology Contact information: [address] [place] [address] [phone]           Newborn Data: Live born female  Birth Weight: 8 lb 13.3 oz (4005 g) APGAR: 23, 9  Newborn Delivery   Birth date/time: 03/21/2019 14:15:00 Delivery type: Vaginal, Spontaneous      Home with mother.  Daria Pastures 03/22/2019, 8:51 AM

## 2019-03-22 NOTE — Anesthesia Postprocedure Evaluation (Signed)
Anesthesia Post Note  Patient: Sara Fletcher  Procedure(s) Performed: AN AD HOC LABOR EPIDURAL     Patient location during evaluation: Mother Baby Anesthesia Type: Epidural Level of consciousness: awake and alert Pain management: pain level controlled Vital Signs Assessment: post-procedure vital signs reviewed and stable Respiratory status: spontaneous breathing, nonlabored ventilation and respiratory function stable Cardiovascular status: stable Postop Assessment: no headache, no backache, epidural receding, no apparent nausea or vomiting, patient able to bend at knees, adequate PO intake and able to ambulate Anesthetic complications: no    Last Vitals:  Vitals:   03/22/19 0032 03/22/19 0501  BP: 119/74 123/71  Pulse: 89 76  Resp: 18 18  Temp: 36.5 C 36.4 C  SpO2: 99% 100%    Last Pain:  Vitals:   03/22/19 0731  TempSrc:   PainSc: 7    Pain Goal:                   Jabier Mutton

## 2019-03-23 LAB — TYPE AND SCREEN
ABO/RH(D): A NEG
Antibody Screen: POSITIVE
Unit division: 0
Unit division: 0

## 2019-03-23 LAB — RH IG WORKUP (INCLUDES ABO/RH)
ABO/RH(D): A NEG
Fetal Screen: NEGATIVE
Gestational Age(Wks): 39
Unit division: 0

## 2019-03-23 LAB — BPAM RBC
Blood Product Expiration Date: 202007092359
Blood Product Expiration Date: 202007102359
Unit Type and Rh: 600
Unit Type and Rh: 600

## 2019-06-24 DIAGNOSIS — L859 Epidermal thickening, unspecified: Secondary | ICD-10-CM | POA: Diagnosis not present

## 2019-06-24 DIAGNOSIS — Z6823 Body mass index (BMI) 23.0-23.9, adult: Secondary | ICD-10-CM | POA: Diagnosis not present

## 2019-06-24 DIAGNOSIS — L989 Disorder of the skin and subcutaneous tissue, unspecified: Secondary | ICD-10-CM | POA: Diagnosis not present

## 2019-07-18 DIAGNOSIS — Z808 Family history of malignant neoplasm of other organs or systems: Secondary | ICD-10-CM | POA: Diagnosis not present

## 2019-07-18 DIAGNOSIS — Z6823 Body mass index (BMI) 23.0-23.9, adult: Secondary | ICD-10-CM | POA: Diagnosis not present

## 2019-07-18 DIAGNOSIS — F419 Anxiety disorder, unspecified: Secondary | ICD-10-CM | POA: Diagnosis not present

## 2019-08-06 DIAGNOSIS — F419 Anxiety disorder, unspecified: Secondary | ICD-10-CM | POA: Diagnosis not present

## 2019-09-16 DIAGNOSIS — Z20828 Contact with and (suspected) exposure to other viral communicable diseases: Secondary | ICD-10-CM | POA: Diagnosis not present

## 2019-09-16 DIAGNOSIS — J069 Acute upper respiratory infection, unspecified: Secondary | ICD-10-CM | POA: Diagnosis not present

## 2019-09-19 DIAGNOSIS — J45909 Unspecified asthma, uncomplicated: Secondary | ICD-10-CM | POA: Diagnosis not present

## 2019-09-19 DIAGNOSIS — U071 COVID-19: Secondary | ICD-10-CM | POA: Diagnosis not present

## 2019-09-24 IMAGING — DX DG ABDOMEN 1V
2 series · 2 of 2 positions shown · non-contrast
Comparison: None.

CLINICAL DATA: Right-sided abdominal pain

EXAM:
ABDOMEN - 1 VIEW

[abdomen kub (1 of 2)]
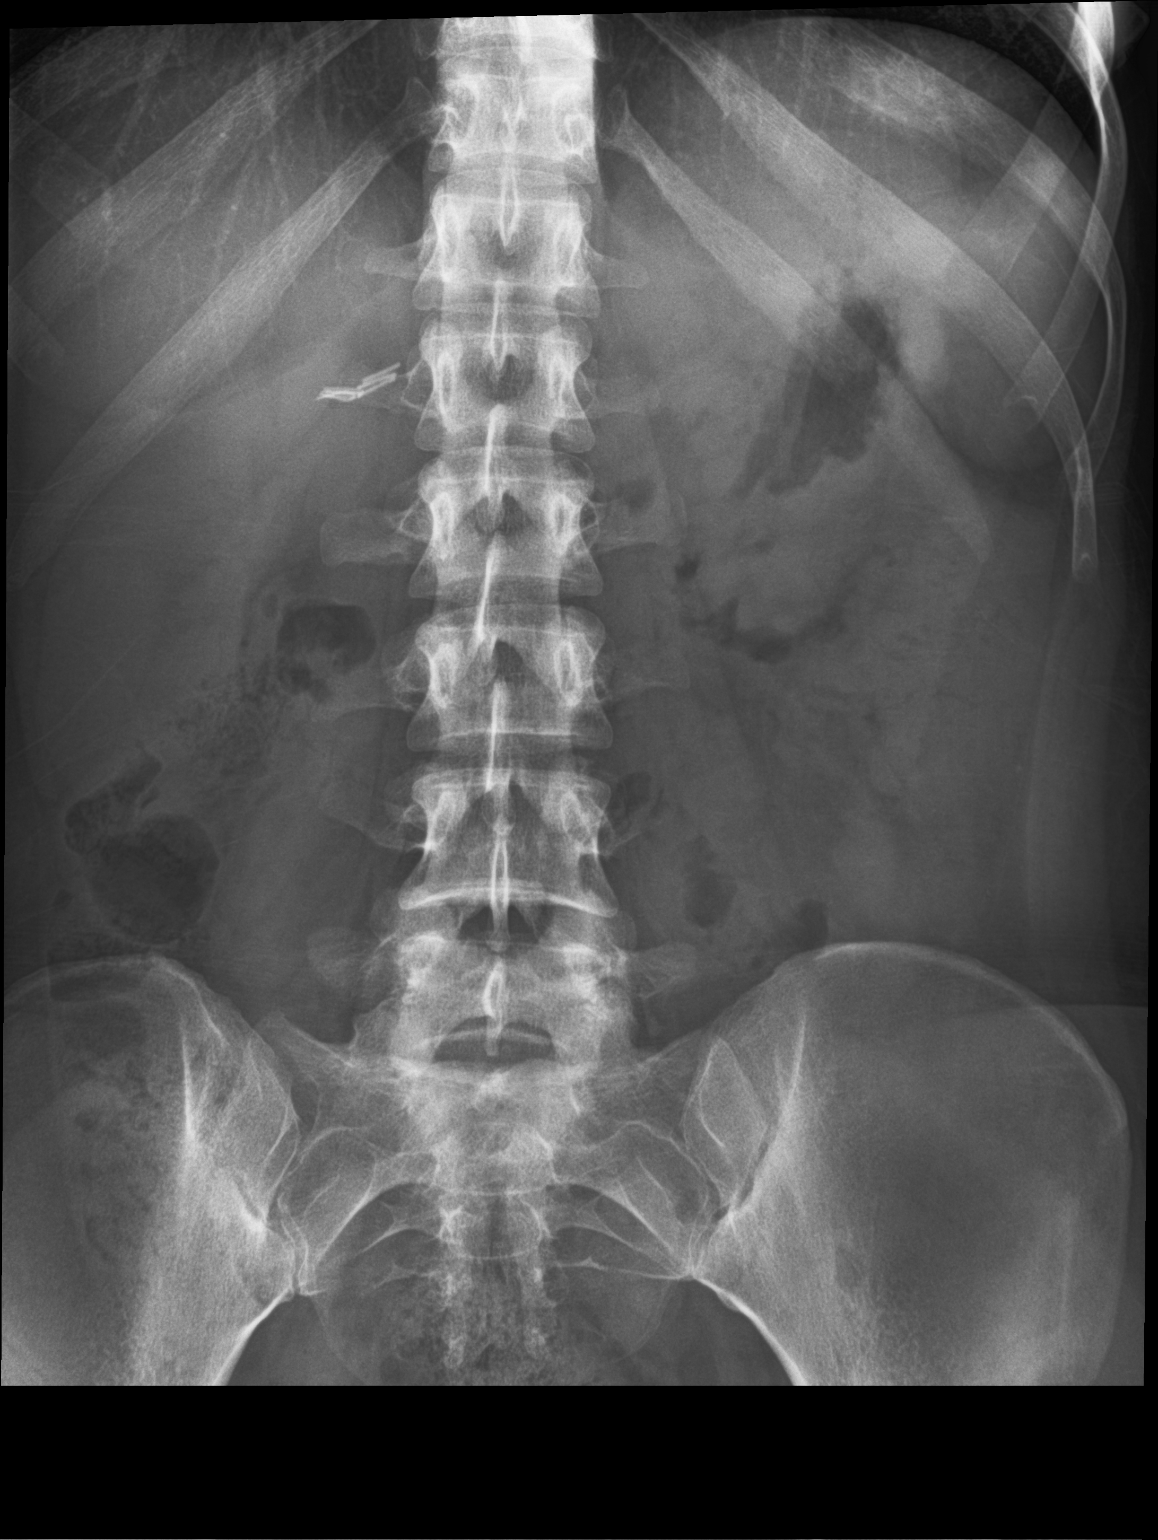

[abdomen kub (2 of 2)]
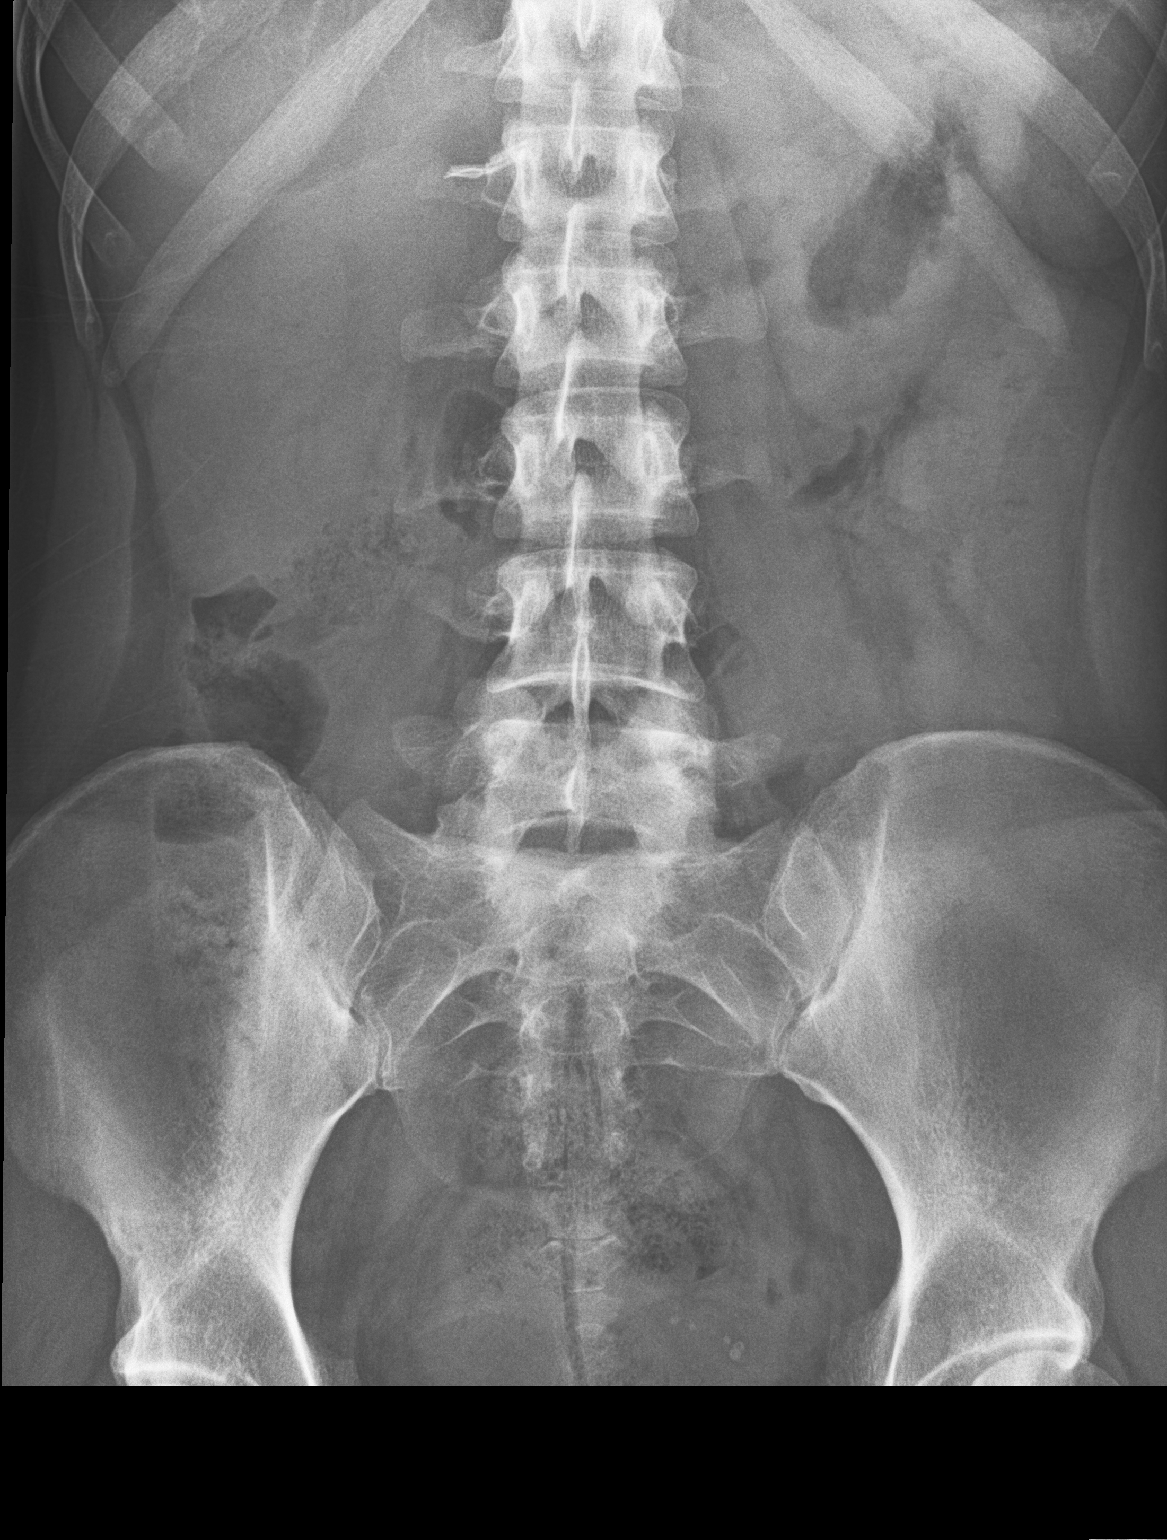

[2 of 2 positions shown; findings below may reference images not displayed]

FINDINGS: Scattered large and small bowel gas is noted. No abnormal mass or
abnormal calculi are seen. No free air is noted. No bony abnormality
is seen. Changes of prior cholecystectomy are noted.
IMPRESSION: No acute abnormality noted.

## 2019-11-11 DIAGNOSIS — J069 Acute upper respiratory infection, unspecified: Secondary | ICD-10-CM | POA: Diagnosis not present

## 2019-11-11 DIAGNOSIS — Z20828 Contact with and (suspected) exposure to other viral communicable diseases: Secondary | ICD-10-CM | POA: Diagnosis not present

## 2019-11-11 DIAGNOSIS — M791 Myalgia, unspecified site: Secondary | ICD-10-CM | POA: Diagnosis not present

## 2019-11-11 DIAGNOSIS — J111 Influenza due to unidentified influenza virus with other respiratory manifestations: Secondary | ICD-10-CM | POA: Diagnosis not present

## 2020-05-04 DIAGNOSIS — Z1152 Encounter for screening for COVID-19: Secondary | ICD-10-CM | POA: Diagnosis not present

## 2020-05-04 DIAGNOSIS — J069 Acute upper respiratory infection, unspecified: Secondary | ICD-10-CM | POA: Diagnosis not present

## 2020-05-04 DIAGNOSIS — R0981 Nasal congestion: Secondary | ICD-10-CM | POA: Diagnosis not present

## 2020-06-11 DIAGNOSIS — J209 Acute bronchitis, unspecified: Secondary | ICD-10-CM | POA: Diagnosis not present

## 2020-06-11 DIAGNOSIS — Z20822 Contact with and (suspected) exposure to covid-19: Secondary | ICD-10-CM | POA: Diagnosis not present
